# Patient Record
Sex: Female | Born: 1985 | Race: White | Hispanic: No | Smoking: Former smoker
Health system: Southern US, Community
[De-identification: ages and names within clinical notes are randomized; demographics above are authoritative.]

## PROBLEM LIST (undated history)

## (undated) DIAGNOSIS — IMO0002 Reserved for concepts with insufficient information to code with codable children: Secondary | ICD-10-CM

## (undated) DIAGNOSIS — A630 Anogenital (venereal) warts: Secondary | ICD-10-CM

## (undated) DIAGNOSIS — F419 Anxiety disorder, unspecified: Secondary | ICD-10-CM

## (undated) DIAGNOSIS — N730 Acute parametritis and pelvic cellulitis: Secondary | ICD-10-CM

## (undated) DIAGNOSIS — F329 Major depressive disorder, single episode, unspecified: Secondary | ICD-10-CM

## (undated) DIAGNOSIS — J189 Pneumonia, unspecified organism: Secondary | ICD-10-CM

## (undated) DIAGNOSIS — F32A Depression, unspecified: Secondary | ICD-10-CM

## (undated) HISTORY — PX: INCISE AND DRAIN ABCESS: PRO64

## (undated) HISTORY — PX: BREAST SURGERY: SHX581

## (undated) HISTORY — DX: Acute parametritis and pelvic cellulitis: N73.0

---
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
## 2008-01-02 ENCOUNTER — Inpatient Hospital Stay (HOSPITAL_COMMUNITY): Admission: AD | Admit: 2008-01-02 | Discharge: 2008-01-02 | Payer: Self-pay | Admitting: Obstetrics & Gynecology

## 2008-08-12 ENCOUNTER — Inpatient Hospital Stay (HOSPITAL_COMMUNITY): Admission: AD | Admit: 2008-08-12 | Discharge: 2008-08-14 | Payer: Self-pay | Admitting: Obstetrics & Gynecology

## 2008-08-12 ENCOUNTER — Encounter (INDEPENDENT_AMBULATORY_CARE_PROVIDER_SITE_OTHER): Payer: Self-pay | Admitting: Obstetrics and Gynecology

## 2008-09-01 ENCOUNTER — Inpatient Hospital Stay (HOSPITAL_COMMUNITY): Admission: AD | Admit: 2008-09-01 | Discharge: 2008-09-01 | Payer: Self-pay | Admitting: Obstetrics and Gynecology

## 2009-09-01 ENCOUNTER — Inpatient Hospital Stay (HOSPITAL_COMMUNITY): Admission: AD | Admit: 2009-09-01 | Discharge: 2009-09-01 | Payer: Self-pay | Admitting: Obstetrics and Gynecology

## 2010-05-12 ENCOUNTER — Inpatient Hospital Stay (HOSPITAL_COMMUNITY)
Admission: AD | Admit: 2010-05-12 | Discharge: 2010-05-12 | Payer: Self-pay | Source: Home / Self Care | Attending: Obstetrics & Gynecology | Admitting: Obstetrics & Gynecology

## 2010-05-14 LAB — URINALYSIS, ROUTINE W REFLEX MICROSCOPIC
Bilirubin Urine: NEGATIVE
Hgb urine dipstick: NEGATIVE
Ketones, ur: NEGATIVE mg/dL
Nitrite: NEGATIVE
Protein, ur: NEGATIVE mg/dL
Specific Gravity, Urine: <1.005 — ABNORMAL LOW (ref 1.005–1.030)
Urine Glucose, Fasting: NEGATIVE mg/dL
Urobilinogen, UA: 0.2 mg/dL (ref 0.0–1.0)
pH: 6 (ref 5.0–8.0)

## 2010-06-16 ENCOUNTER — Inpatient Hospital Stay (HOSPITAL_COMMUNITY)
Admission: AD | Admit: 2010-06-16 | Discharge: 2010-06-19 | DRG: 775 | Disposition: A | Discharge status: Home / Self Care | Payer: Medicaid Other | Source: Ambulatory Visit | Attending: Obstetrics and Gynecology | Admitting: Obstetrics and Gynecology

## 2010-06-16 DIAGNOSIS — Z22330 Carrier of Group B streptococcus: Secondary | ICD-10-CM

## 2010-06-16 DIAGNOSIS — O99892 Other specified diseases and conditions complicating childbirth: Secondary | ICD-10-CM | POA: Diagnosis present

## 2010-06-17 LAB — RPR: RPR Ser Ql: NONREACTIVE

## 2010-06-17 LAB — CBC
HCT: 31 % — ABNORMAL LOW (ref 36.0–46.0)
Hemoglobin: 10 g/dL — ABNORMAL LOW (ref 12.0–15.0)
MCH: 30.6 pg (ref 26.0–34.0)
MCHC: 32.3 g/dL (ref 30.0–36.0)
MCV: 94.8 fL (ref 78.0–100.0)
Platelets: 192 10*3/uL (ref 150–400)
RBC: 3.27 MIL/uL — ABNORMAL LOW (ref 3.87–5.11)
RDW: 12.7 % (ref 11.5–15.5)
WBC: 13.2 10*3/uL — ABNORMAL HIGH (ref 4.0–10.5)

## 2010-06-17 LAB — ABO/RH: ABO/RH(D): O POS

## 2010-06-18 LAB — CBC
HCT: 30.7 % — ABNORMAL LOW (ref 36.0–46.0)
Hemoglobin: 9.7 g/dL — ABNORMAL LOW (ref 12.0–15.0)
MCH: 30.4 pg (ref 26.0–34.0)
MCHC: 31.6 g/dL (ref 30.0–36.0)
MCV: 96.2 fL (ref 78.0–100.0)
Platelets: 167 10*3/uL (ref 150–400)
RBC: 3.19 MIL/uL — ABNORMAL LOW (ref 3.87–5.11)
RDW: 12.7 % (ref 11.5–15.5)
WBC: 9.8 10*3/uL (ref 4.0–10.5)

## 2010-07-15 LAB — CBC
HCT: 37.1 % (ref 36.0–46.0)
Hemoglobin: 12.9 g/dL (ref 12.0–15.0)
MCHC: 34.9 g/dL (ref 30.0–36.0)
MCV: 97.9 fL (ref 78.0–100.0)
Platelets: 190 10*3/uL (ref 150–400)
RBC: 3.79 MIL/uL — ABNORMAL LOW (ref 3.87–5.11)
RDW: 12.2 % (ref 11.5–15.5)
WBC: 9.9 10*3/uL (ref 4.0–10.5)

## 2010-07-15 LAB — URINALYSIS, ROUTINE W REFLEX MICROSCOPIC
Bilirubin Urine: NEGATIVE
Glucose, UA: NEGATIVE mg/dL
Hgb urine dipstick: NEGATIVE
Ketones, ur: 15 mg/dL — AB
Nitrite: NEGATIVE
Protein, ur: NEGATIVE mg/dL
Specific Gravity, Urine: >1.03 — ABNORMAL HIGH (ref 1.005–1.030)
Urobilinogen, UA: 0.2 mg/dL (ref 0.0–1.0)
pH: 5 (ref 5.0–8.0)

## 2010-07-15 LAB — DIFFERENTIAL
Basophils Absolute: 0 10*3/uL (ref 0.0–0.1)
Basophils Relative: 0 % (ref 0–1)
Eosinophils Absolute: 0.1 10*3/uL (ref 0.0–0.7)
Eosinophils Relative: 1 % (ref 0–5)
Lymphocytes Relative: 30 % (ref 12–46)
Lymphs Abs: 3 10*3/uL (ref 0.7–4.0)
Monocytes Absolute: 0.6 10*3/uL (ref 0.1–1.0)
Monocytes Relative: 7 % (ref 3–12)
Neutro Abs: 6.2 10*3/uL (ref 1.7–7.7)
Neutrophils Relative %: 62 % (ref 43–77)

## 2010-07-15 LAB — WET PREP, GENITAL
Trich, Wet Prep: NONE SEEN
Yeast Wet Prep HPF POC: NONE SEEN

## 2010-07-15 LAB — GC/CHLAMYDIA PROBE AMP, GENITAL
Chlamydia, DNA Probe: NEGATIVE
GC Probe Amp, Genital: NEGATIVE

## 2010-08-06 LAB — CBC
HCT: 26.2 % — ABNORMAL LOW (ref 36.0–46.0)
HCT: 27.7 % — ABNORMAL LOW (ref 36.0–46.0)
Hemoglobin: 8.7 g/dL — ABNORMAL LOW (ref 12.0–15.0)
Hemoglobin: 9.4 g/dL — ABNORMAL LOW (ref 12.0–15.0)
MCHC: 33.1 g/dL (ref 30.0–36.0)
MCHC: 33.9 g/dL (ref 30.0–36.0)
MCV: 84.3 fL (ref 78.0–100.0)
MCV: 85.2 fL (ref 78.0–100.0)
Platelets: 186 10*3/uL (ref 150–400)
Platelets: 191 10*3/uL (ref 150–400)
RBC: 3.08 MIL/uL — ABNORMAL LOW (ref 3.87–5.11)
RBC: 3.29 MIL/uL — ABNORMAL LOW (ref 3.87–5.11)
RDW: 15.2 % (ref 11.5–15.5)
RDW: 15.2 % (ref 11.5–15.5)
WBC: 16.6 10*3/uL — ABNORMAL HIGH (ref 4.0–10.5)
WBC: 17.1 10*3/uL — ABNORMAL HIGH (ref 4.0–10.5)

## 2010-08-06 LAB — RPR: RPR Ser Ql: NONREACTIVE

## 2011-01-28 LAB — POCT PREGNANCY, URINE: Preg Test, Ur: POSITIVE

## 2011-04-28 HISTORY — PX: COSMETIC SURGERY: SHX468

## 2011-05-12 ENCOUNTER — Inpatient Hospital Stay (HOSPITAL_COMMUNITY)
Admission: AD | Admit: 2011-05-12 | Discharge: 2011-05-12 | Disposition: A | Discharge status: Home / Self Care | Payer: Self-pay | Source: Ambulatory Visit | Attending: Obstetrics & Gynecology | Admitting: Obstetrics & Gynecology

## 2011-05-12 ENCOUNTER — Encounter (HOSPITAL_COMMUNITY): Payer: Self-pay | Admitting: *Deleted

## 2011-05-12 DIAGNOSIS — Z30432 Encounter for removal of intrauterine contraceptive device: Secondary | ICD-10-CM | POA: Insufficient documentation

## 2011-05-12 DIAGNOSIS — M545 Low back pain, unspecified: Secondary | ICD-10-CM | POA: Insufficient documentation

## 2011-05-12 DIAGNOSIS — R102 Pelvic and perineal pain: Secondary | ICD-10-CM

## 2011-05-12 DIAGNOSIS — N949 Unspecified condition associated with female genital organs and menstrual cycle: Secondary | ICD-10-CM

## 2011-05-12 HISTORY — DX: Reserved for concepts with insufficient information to code with codable children: IMO0002

## 2011-05-12 HISTORY — DX: Depression, unspecified: F32.A

## 2011-05-12 HISTORY — DX: Major depressive disorder, single episode, unspecified: F32.9

## 2011-05-12 HISTORY — DX: Anxiety disorder, unspecified: F41.9

## 2011-05-12 HISTORY — DX: Anogenital (venereal) warts: A63.0

## 2011-05-12 LAB — URINALYSIS, ROUTINE W REFLEX MICROSCOPIC
Bilirubin Urine: NEGATIVE
Glucose, UA: NEGATIVE mg/dL
Hgb urine dipstick: NEGATIVE
Ketones, ur: NEGATIVE mg/dL
Leukocytes, UA: NEGATIVE
Nitrite: NEGATIVE
Protein, ur: NEGATIVE mg/dL
Specific Gravity, Urine: 1.01 (ref 1.005–1.030)
Urobilinogen, UA: 0.2 mg/dL (ref 0.0–1.0)
pH: 6.5 (ref 5.0–8.0)

## 2011-05-12 LAB — POCT PREGNANCY, URINE: Preg Test, Ur: NEGATIVE

## 2011-05-12 MED ORDER — KETOROLAC TROMETHAMINE 60 MG/2ML IM SOLN
60.0000 mg | Freq: Once | INTRAMUSCULAR | Status: AC
  Administered 2011-05-12: 60 mg via INTRAMUSCULAR
  Filled 2011-05-12: qty 2

## 2011-05-12 MED ORDER — OXYCODONE-ACETAMINOPHEN 5-325 MG PO TABS: 1.0000 | ORAL_TABLET | Freq: Four times a day (QID) | ORAL | Status: DC | PRN

## 2011-05-12 MED ORDER — OXYCODONE-ACETAMINOPHEN 5-325 MG PO TABS: 1.0000 | ORAL_TABLET | Freq: Four times a day (QID) | ORAL | Status: AC | PRN

## 2011-05-12 NOTE — Progress Notes (Signed)
Patient states she had a Mirena IUD placed after her last delivery last February. Has been having increasing low back pain and sharp vaginal pain for about 4 months ago. Has painful intercourse. Was seen at [facility] last night and diagnosed with an ovarian cyst but would not remove the IUD. Was given Tylenol 3 which is not helping.

## 2011-05-12 NOTE — Progress Notes (Signed)
States would like to have IUD taken out. States she would like to try Depo, but does not know if she can afford it. States she was diagnosed with chronic depression and was on medication when she still lived with her parents. States she cannot afford medications now. States she feels OK without meds. most of the time.

## 2011-05-12 NOTE — Progress Notes (Signed)
States had U/S at [facility] to assure correct placement of IUD. Was told she had an ovarian cyst. Was told they would not remove IUD.

## 2011-05-12 NOTE — ED Provider Notes (Signed)
History   Patient is a 26 year old [id number] who presents with chief complaint of lower back pain and request to have her Mirena IUD removed.  She visited [facility] last night requesting her Mirena be removed and it was not.  Pelvic ultrasound at [facility] revealed an ovarian cyst.  She does not currently have insurance therefore she did not schedule an appointment with Dr. Dareen Piano and instead reported to the MAU.  Patient reports recurrent yeast and urinary tract infections over the previous 4 months, lower back and abdominal pain not responsive to Ibuprofen and Tylenol 3.  Patient denies dysuria, vaginal discharge, hematuria, chest pain, shortness of breath, blurry vision and headaches.  It is the patient's clear preference to have the IUD removed.        Chief Complaint  Patient presents with  . Back Pain   HPI     Past Medical History  Diagnosis Date  . Abnormal Pap smear   . HPV (human papilloma virus) anogenital infection   . Depression   . Anxiety     History reviewed. No pertinent past surgical history.  History reviewed. No pertinent family history.  History  Substance Use Topics  . Smoking status: Current Everyday Smoker -- 0.5 packs/day  . Smokeless tobacco: Never Used  . Alcohol Use: Yes     Rarely    Allergies: No Known Allergies  Prescriptions prior to admission  Medication Sig Dispense Refill  . acetaminophen-codeine (TYLENOL #3) 300-30 MG per tablet Take 2 tablets by mouth every 4 (four) hours as needed. Takes for pain        Review of Systems  Constitutional: Positive for malaise/fatigue. Negative for fever and chills.  Eyes: Negative for blurred vision.  Respiratory: Negative for shortness of breath.   Cardiovascular: Negative for chest pain and leg swelling.  Gastrointestinal: Positive for abdominal pain (hypogastric) and blood in stool. Negative for nausea, vomiting and diarrhea.  Genitourinary: Negative for dysuria and hematuria.    Musculoskeletal: Positive for back pain.  Neurological: Negative for loss of consciousness and headaches.   Physical Exam   Blood pressure 125/79, pulse 117, resp. rate 20, height 5' 4.5" (1.638 m), weight 53.524 kg (118 lb), SpO2 97.00%.  Physical Exam  Constitutional: She is oriented to person, place, and time. She appears well-developed and well-nourished.  HENT:  Head: Normocephalic and atraumatic.  Eyes: Pupils are equal, round, and reactive to light.  Cardiovascular: Regular rhythm and normal heart sounds.  Exam reveals no gallop and no friction rub.   No murmur heard. Respiratory: Effort normal and breath sounds normal. No respiratory distress. She has no wheezes.  GI: Soft. She exhibits no mass. There is tenderness (hypogastric). There is no rebound.  Genitourinary: Uterus is tender (mild). Cervix exhibits no motion tenderness and no discharge. Right adnexum displays tenderness (mild). Right adnexum displays no mass and no fullness. Left adnexum displays tenderness (mild). Left adnexum displays no mass and no fullness. There is tenderness (mild) around the vagina. No bleeding around the vagina. No signs of injury around the vagina. No vaginal discharge found.  Neurological: She is alert and oriented to person, place, and time.  Skin: Skin is warm and dry.  Psychiatric: She has a normal mood and affect. Her behavior is normal.    MAU Course  Procedures: IUD string located and removed easily with ring forceps.  No bleeding seen after removal.   Per patient, cultures were taken at [facility] last night.  Not repeated today.  Toradol 60 mg given IM in MAU  Having trouble with printing of Percocet #10==computer sent it electronically.  I called pharmacy in [facility] to alert them and they will not fill Rx.  Informed them I will try again to print Rx.    Assessment and Plan  Assessment 1) Pelvic pain 2) Request for IUD removal Plan 1) Discussed at length the patient's  return to fertility upon IUD removal.  Instructed her to use condoms until seen by Dr. Dareen Piano.  Call office to make appointment.   2) Rx for percocet #10  PIERCE, BENJAMIN 05/12/2011, 2:30 PM   Matt Holmes, NP 05/12/11 1515

## 2011-10-22 ENCOUNTER — Encounter: Payer: Self-pay | Admitting: Obstetrics & Gynecology

## 2011-11-27 ENCOUNTER — Encounter: Payer: Self-pay | Admitting: Obstetrics & Gynecology

## 2011-11-27 ENCOUNTER — Ambulatory Visit (INDEPENDENT_AMBULATORY_CARE_PROVIDER_SITE_OTHER): Payer: Self-pay | Admitting: Obstetrics & Gynecology

## 2011-11-27 VITALS — BP 117/68 | HR 103 | Temp 97.4°F | Ht 64.0 in | Wt 108.7 lb

## 2011-11-27 DIAGNOSIS — N949 Unspecified condition associated with female genital organs and menstrual cycle: Secondary | ICD-10-CM

## 2011-11-27 DIAGNOSIS — R102 Pelvic and perineal pain: Secondary | ICD-10-CM

## 2011-11-27 LAB — WET PREP, GENITAL: Trich, Wet Prep: NONE SEEN

## 2011-11-27 MED ORDER — IBUPROFEN 800 MG PO TABS: 800.0000 mg | ORAL_TABLET | Freq: Three times a day (TID) | ORAL | Status: AC | PRN

## 2011-11-27 MED ORDER — IBUPROFEN 200 MG PO TABS: ORAL_TABLET | ORAL | Status: DC

## 2011-11-27 NOTE — Progress Notes (Signed)
States here because thought she needs a biopsy as told by [facility] . Also was seen at [facility] for PID few months ago and treated and pain resolved until it came back recently.  Discussed with Dr. Marice Potter and will see for pelvic pain/PID today and get records and see another time for fu for abnormal pap

## 2011-11-27 NOTE — Progress Notes (Signed)
  Subjective:    Patient ID: Janice Saunders, female    DOB: 1986/01/08, 26 y.o.   MRN: [id number]  HPI  26 yo with a 2 day h/o pelvic pain. She has tried an occasional IBU 4-200mg  tabs. She tells me she was diagnosed with PID several months ago and treated with ABX. She tells me that she has been with the same man for 7 years. Sex 3 days ago caused some pain. She denies a h/o GC or CT.   Review of Systems   She tells me her pap at the [facility] was abnormal and that she needs a biopsy (Neither she nor I have a copy of this pap) Objective:   Physical Exam Normal vaginal/cervical discharge.  No CMT Uterus- NSSA, mobile, she describes pain with deep palpation of uterus Adnexa- NT, no masses       Assessment & Plan:  Pelvic pain, recurrrent. I have offered RTC IBU 800 mg (but not narcotics). I will get a wet prep, cervical cultures, and a u/s.

## 2011-11-28 LAB — GC/CHLAMYDIA PROBE AMP, URINE
Chlamydia, Swab/Urine, PCR: NEGATIVE
GC Probe Amp, Urine: NEGATIVE

## 2011-12-02 ENCOUNTER — Telehealth: Payer: Self-pay | Admitting: *Deleted

## 2011-12-02 DIAGNOSIS — B373 Candidiasis of vulva and vagina: Secondary | ICD-10-CM

## 2011-12-02 MED ORDER — FLUCONAZOLE 150 MG PO TABS: 150.0000 mg | ORAL_TABLET | Freq: Once | ORAL | Status: AC

## 2011-12-02 NOTE — Telephone Encounter (Signed)
Message copied by Mannie Stabile on Wed Dec 02, 2011  9:27 AM ------      Message from: Nicholaus Bloom C      Created: Mon Nov 30, 2011  1:14 PM       Can you please call her in diflucan 150 mg once, to be repeated once in 3 days.      Thanks

## 2011-12-02 NOTE — Telephone Encounter (Signed)
Spoke with patient and informed her that she has a prescription to pick up.

## 2011-12-04 ENCOUNTER — Ambulatory Visit (HOSPITAL_COMMUNITY): Admission: RE | Admit: 2011-12-04 | Payer: Self-pay | Source: Ambulatory Visit

## 2011-12-24 ENCOUNTER — Telehealth: Payer: Self-pay | Admitting: *Deleted

## 2011-12-24 NOTE — Telephone Encounter (Signed)
Called pt about her missed [place] appt on 12/04/11.  I asked if she would like to re-schedule. She said that she would like another appt on any day after 1300. She said I can call back with appt info. I scheduled appt and called back- had to leave a message that her appt will be on 12/30/11 @ 1345. She should arrive @ 1330 and have a full bladder. She may call back if she has questions.

## 2011-12-30 ENCOUNTER — Ambulatory Visit (HOSPITAL_COMMUNITY): Payer: Self-pay

## 2012-07-26 ENCOUNTER — Encounter (HOSPITAL_COMMUNITY): Payer: Self-pay | Admitting: Emergency Medicine

## 2012-07-26 ENCOUNTER — Emergency Department (HOSPITAL_COMMUNITY): Payer: Medicaid Other

## 2012-07-26 ENCOUNTER — Inpatient Hospital Stay (HOSPITAL_COMMUNITY)
Admission: EM | Admit: 2012-07-26 | Discharge: 2012-07-28 | DRG: 917 | Disposition: A | Discharge status: Other Acute Inpatient Hospital | Payer: Medicaid Other | Attending: Internal Medicine | Admitting: Internal Medicine

## 2012-07-26 DIAGNOSIS — T50901D Poisoning by unspecified drugs, medicaments and biological substances, accidental (unintentional), subsequent encounter: Secondary | ICD-10-CM

## 2012-07-26 DIAGNOSIS — T401X4A Poisoning by heroin, undetermined, initial encounter: Principal | ICD-10-CM

## 2012-07-26 DIAGNOSIS — G929 Unspecified toxic encephalopathy: Secondary | ICD-10-CM | POA: Diagnosis present

## 2012-07-26 DIAGNOSIS — F17200 Nicotine dependence, unspecified, uncomplicated: Secondary | ICD-10-CM | POA: Diagnosis present

## 2012-07-26 DIAGNOSIS — A630 Anogenital (venereal) warts: Secondary | ICD-10-CM | POA: Diagnosis present

## 2012-07-26 DIAGNOSIS — G9340 Encephalopathy, unspecified: Secondary | ICD-10-CM | POA: Diagnosis present

## 2012-07-26 DIAGNOSIS — F329 Major depressive disorder, single episode, unspecified: Secondary | ICD-10-CM | POA: Diagnosis present

## 2012-07-26 DIAGNOSIS — T401X1A Poisoning by heroin, accidental (unintentional), initial encounter: Secondary | ICD-10-CM | POA: Diagnosis present

## 2012-07-26 DIAGNOSIS — T50901A Poisoning by unspecified drugs, medicaments and biological substances, accidental (unintentional), initial encounter: Secondary | ICD-10-CM

## 2012-07-26 DIAGNOSIS — B977 Papillomavirus as the cause of diseases classified elsewhere: Secondary | ICD-10-CM

## 2012-07-26 DIAGNOSIS — D649 Anemia, unspecified: Secondary | ICD-10-CM | POA: Diagnosis present

## 2012-07-26 DIAGNOSIS — Y9289 Other specified places as the place of occurrence of the external cause: Secondary | ICD-10-CM

## 2012-07-26 DIAGNOSIS — R0789 Other chest pain: Secondary | ICD-10-CM | POA: Diagnosis present

## 2012-07-26 DIAGNOSIS — F419 Anxiety disorder, unspecified: Secondary | ICD-10-CM | POA: Diagnosis present

## 2012-07-26 DIAGNOSIS — G92 Toxic encephalopathy: Secondary | ICD-10-CM | POA: Diagnosis present

## 2012-07-26 DIAGNOSIS — F1310 Sedative, hypnotic or anxiolytic abuse, uncomplicated: Secondary | ICD-10-CM | POA: Diagnosis present

## 2012-07-26 DIAGNOSIS — E876 Hypokalemia: Secondary | ICD-10-CM | POA: Diagnosis present

## 2012-07-26 DIAGNOSIS — F1210 Cannabis abuse, uncomplicated: Secondary | ICD-10-CM | POA: Diagnosis present

## 2012-07-26 DIAGNOSIS — F3289 Other specified depressive episodes: Secondary | ICD-10-CM | POA: Diagnosis present

## 2012-07-26 DIAGNOSIS — N730 Acute parametritis and pelvic cellulitis: Secondary | ICD-10-CM | POA: Diagnosis present

## 2012-07-26 DIAGNOSIS — F1120 Opioid dependence, uncomplicated: Secondary | ICD-10-CM | POA: Diagnosis present

## 2012-07-26 DIAGNOSIS — F411 Generalized anxiety disorder: Secondary | ICD-10-CM | POA: Diagnosis present

## 2012-07-26 LAB — TSH: TSH: 1.557 u[IU]/mL (ref 0.350–4.500)

## 2012-07-26 LAB — COMPREHENSIVE METABOLIC PANEL
ALT: 13 U/L (ref 0–35)
AST: 17 U/L (ref 0–37)
CO2: 23 mEq/L (ref 19–32)
Calcium: 8.2 mg/dL — ABNORMAL LOW (ref 8.4–10.5)
Creatinine, Ser: 0.53 mg/dL (ref 0.50–1.10)
GFR calc non Af Amer: >90 mL/min (ref >90)
Sodium: 139 mEq/L (ref 135–145)
Total Protein: 6.1 g/dL (ref 6.0–8.3)

## 2012-07-26 LAB — ETHANOL: Alcohol, Ethyl (B): <11 mg/dL (ref 0–11)

## 2012-07-26 LAB — RAPID URINE DRUG SCREEN, HOSP PERFORMED
Amphetamines: NOT DETECTED
Cocaine: NOT DETECTED
Opiates: POSITIVE — AB

## 2012-07-26 LAB — URINALYSIS, ROUTINE W REFLEX MICROSCOPIC
Hgb urine dipstick: NEGATIVE
Nitrite: NEGATIVE
Specific Gravity, Urine: 1.028 (ref 1.005–1.030)
Urobilinogen, UA: 0.2 mg/dL (ref 0.0–1.0)
pH: 5.5 (ref 5.0–8.0)

## 2012-07-26 LAB — CBC WITH DIFFERENTIAL/PLATELET
Basophils Absolute: 0 10*3/uL (ref 0.0–0.1)
Basophils Relative: 0 % (ref 0–1)
Eosinophils Absolute: 0.1 10*3/uL (ref 0.0–0.7)
Eosinophils Relative: 1 % (ref 0–5)
HCT: 35.5 % — ABNORMAL LOW (ref 36.0–46.0)
MCH: 33.3 pg (ref 26.0–34.0)
MCHC: 33.8 g/dL (ref 30.0–36.0)
MCV: 98.6 fL (ref 78.0–100.0)
Monocytes Absolute: 0.6 10*3/uL (ref 0.1–1.0)
Platelets: 218 10*3/uL (ref 150–400)
RDW: 11.9 % (ref 11.5–15.5)
WBC: 9.7 10*3/uL (ref 4.0–10.5)

## 2012-07-26 LAB — URINE MICROSCOPIC-ADD ON

## 2012-07-26 LAB — PREGNANCY, URINE: Preg Test, Ur: NEGATIVE

## 2012-07-26 LAB — SALICYLATE LEVEL: Salicylate Lvl: <2 mg/dL — ABNORMAL LOW (ref 2.8–20.0)

## 2012-07-26 MED ORDER — NICOTINE 21 MG/24HR TD PT24: 21.0000 mg | MEDICATED_PATCH | Freq: Every day | TRANSDERMAL | Status: DC

## 2012-07-26 MED ORDER — SODIUM CHLORIDE 0.9 % IV SOLN
INTRAVENOUS | Status: DC
  Administered 2012-07-26 – 2012-07-27 (×3): via INTRAVENOUS

## 2012-07-26 MED ORDER — SODIUM CHLORIDE 0.9 % IJ SOLN
3.0000 mL | Freq: Two times a day (BID) | INTRAMUSCULAR | Status: DC
  Administered 2012-07-28: 3 mL via INTRAVENOUS

## 2012-07-26 MED ORDER — ACETAMINOPHEN 325 MG PO TABS
650.0000 mg | ORAL_TABLET | Freq: Four times a day (QID) | ORAL | Status: DC | PRN
  Filled 2012-07-26 (×2): qty 2

## 2012-07-26 MED ORDER — ALBUTEROL SULFATE (5 MG/ML) 0.5% IN NEBU: 2.5000 mg | INHALATION_SOLUTION | RESPIRATORY_TRACT | Status: DC | PRN

## 2012-07-26 MED ORDER — NALOXONE HCL 0.4 MG/ML IJ SOLN
0.4000 mg | Freq: Once | INTRAMUSCULAR | Status: AC
  Administered 2012-07-26: 0.4 mg via INTRAVENOUS
  Filled 2012-07-26: qty 1

## 2012-07-26 MED ORDER — NALOXONE HCL 1 MG/ML IJ SOLN
0.2500 mg/h | INTRAVENOUS | Status: DC
  Administered 2012-07-26: 0.25 mg/h via INTRAVENOUS
  Filled 2012-07-26: qty 4

## 2012-07-26 MED ORDER — ONDANSETRON HCL 4 MG/2ML IJ SOLN: 4.0000 mg | Freq: Four times a day (QID) | INTRAMUSCULAR | Status: DC | PRN

## 2012-07-26 MED ORDER — CLONAZEPAM 0.5 MG PO TABS
0.5000 mg | ORAL_TABLET | Freq: Three times a day (TID) | ORAL | Status: DC | PRN
  Administered 2012-07-26 – 2012-07-28 (×6): 0.5 mg via ORAL
  Filled 2012-07-26 (×6): qty 1

## 2012-07-26 MED ORDER — ONDANSETRON HCL 4 MG PO TABS
4.0000 mg | ORAL_TABLET | Freq: Four times a day (QID) | ORAL | Status: DC | PRN
  Administered 2012-07-27: 4 mg via ORAL
  Filled 2012-07-26: qty 1

## 2012-07-26 MED ORDER — POLYETHYLENE GLYCOL 3350 17 G PO PACK
17.0000 g | PACK | Freq: Every day | ORAL | Status: DC | PRN
  Filled 2012-07-26: qty 1

## 2012-07-26 MED ORDER — GUAIFENESIN-DM 100-10 MG/5ML PO SYRP: 5.0000 mL | ORAL_SOLUTION | ORAL | Status: DC | PRN

## 2012-07-26 MED ORDER — ALUM & MAG HYDROXIDE-SIMETH 200-200-20 MG/5ML PO SUSP: 30.0000 mL | Freq: Four times a day (QID) | ORAL | Status: DC | PRN

## 2012-07-26 MED ORDER — ACETAMINOPHEN 650 MG RE SUPP: 650.0000 mg | Freq: Four times a day (QID) | RECTAL | Status: DC | PRN

## 2012-07-26 NOTE — H&P (Addendum)
[facility]                                                                                    Patient Demographics  Janice Saunders, is a 27 y.o. female  CSN: [id number]  MRN: [id number]  DOB - 05-04-1985  Admit Date - 07/26/2012  Outpatient Primary MD for the patient is No primary provider on file.   With History of -  Past Medical History  Diagnosis Date  . Abnormal Pap smear   . HPV (human papilloma virus) anogenital infection   . Depression   . Anxiety   . PID (acute pelvic inflammatory disease)       Past Surgical History  Procedure Laterality Date  . Cosmetic surgery  2013    Breast augmentation    in for   Chief Complaint  Patient presents with  . Drug Overdose  . Altered Mental Status     HPI  Janice Saunders  is a 27 y.o. female, with history of PID, discussion drug abuse, who was brought in by EMS after they were found passed out side her mother's apartment in the car, she was found passed out with her husband in the same condition, they were then brought to the ER where after Narcan they provided the history that since they were unable to get hold of prescription medications for dictation they obtained some heroine and proceeded to snort that, in the ER husband was subsequently arrested, patient received some Narcan however became somnolent soon thereafter, urine drug screen was positive for Narcan benzos and marijuana, she was placed on Narcan drip and I was called to admit the patient.   Review of Systems is limited as patient is somnolent, she woke up with sternal rubs and gentle shaking of her shoulders and answered a few questions and then went back to sleep, she is abusive, she says she's not XXXXing drug junky   Brief review of systems  Briefly she denies any headache chest pain or belly pain, says she's not pregnant, says she was not trying to hurt results, says her husband got some heroin as they were unable to get prescription pain  medications she usually use, she then snorted some heroine for recreational purposes.   Social History History  Substance Use Topics  . Smoking status: Current Every Day Smoker -- 0.50 packs/day    Types: Cigarettes  . Smokeless tobacco: Never Used  . Alcohol Use: Yes     Comment: Rarely      Family History Says no history of suicide in family  Prior to Admission medications   Not on File    No Known Allergies  Physical Exam  Vitals  Blood pressure 113/68, pulse 95, temperature 97.6 F (36.4 C), temperature source Oral, resp. rate 16, SpO2 100.00%.   1. General Young white female lying in bed in NAD, she is visibly extremely drowsy, she woke up with sternal rubs and some shaking of shoulders to wake her up,  2. denies any suicidal or homicidal ideations  3. No F.N deficits, ALL C.Nerves Intact, Strength 5/5 all 4 extremities, Sensation intact all 4 extremities, Plantars down going.  4.  Ears and Eyes appear Normal, Conjunctivae clear, PERRLA. Moist Oral Mucosa.  5. Supple Neck, No JVD, No cervical lymphadenopathy appriciated, No Carotid Bruits.  6. Symmetrical Chest wall movement, Good air movement bilaterally, CTAB.  7. RRR, No Gallops, Rubs or Murmurs, No Parasternal Heave.  8. Positive Bowel Sounds, Abdomen Soft, Non tender, No organomegaly appriciated,No rebound -guarding or rigidity.  9.  No Cyanosis, Normal Skin Turgor, No Skin Rash or Bruise. Some tattoos on her skin  10. Good muscle tone,  joints appear normal , no effusions, Normal ROM.  11. No Palpable Lymph Nodes in Neck or Axillae     Data Review  CBC  Recent Labs Lab 07/26/12 1415  WBC 9.7  HGB 12.0  HCT 35.5*  PLT 218  MCV 98.6  MCH 33.3  MCHC 33.8  RDW 11.9  LYMPHSABS 2.2  MONOABS 0.6  EOSABS 0.1  BASOSABS 0.0   ------------------------------------------------------------------------------------------------------------------  Chemistries   Recent Labs Lab 07/26/12 1415   NA 139  K 3.6  CL 107  CO2 23  GLUCOSE 79  BUN 16  CREATININE 0.53  CALCIUM 8.2*  AST 17  ALT 13  ALKPHOS 48  BILITOT 0.5    Results for EIMI, VINEY (MRN [id number]) as of 07/26/2012 16:26  Ref. Range 07/26/2012 14:15 07/26/2012 14:20  Alcohol, Ethyl (B) Latest Range: 0-11 mg/dL <62   Amphetamines Latest Range: NONE DETECTED   NONE DETECTED  Barbiturates Latest Range: NONE DETECTED   NONE DETECTED  Benzodiazepines Latest Range: NONE DETECTED   POSITIVE (A)  Opiates Latest Range: NONE DETECTED   POSITIVE (A)  COCAINE Latest Range: NONE DETECTED   NONE DETECTED  Tetrahydrocannabinol Latest Range: NONE DETECTED   POSITIVE (A)    Results for KEYLAH, DARWISH (MRN [id number]) as of 07/26/2012 16:26  Ref. Range 07/26/2012 14:15  Salicylate Lvl Latest Range: 2.8-20.0 mg/dL <6.9 (L)  Acetaminophen (Tylenol), Serum Latest Range: 10-30 ug/mL <15.0   ------------------------------------------------------------------------------------------------------------------ CrCl is unknown because both a height and weight (above a minimum accepted value) are required for this calculation. ------------------------------------------------------------------------------------------------------------------ No results found for this basename: TSH, T4TOTAL, FREET3, T3FREE, THYROIDAB,  in the last 72 hours   Coagulation profile No results found for this basename: INR, PROTIME,  in the last 168 hours ------------------------------------------------------------------------------------------------------------------- No results found for this basename: DDIMER,  in the last 72 hours -------------------------------------------------------------------------------------------------------------------  Cardiac Enzymes No results found for this basename: CK, CKMB, TROPONINI, MYOGLOBIN,  in the last 168  hours ------------------------------------------------------------------------------------------------------------------ No components found with this basename: POCBNP,    ---------------------------------------------------------------------------------------------------------------  Urinalysis    Component Value Date/Time   COLORURINE AMBER* 07/26/2012 1425   APPEARANCEUR CLEAR 07/26/2012 1425   LABSPEC 1.028 07/26/2012 1425   PHURINE 5.5 07/26/2012 1425   GLUCOSEU NEGATIVE 07/26/2012 1425   HGBUR NEGATIVE 07/26/2012 1425   BILIRUBINUR SMALL* 07/26/2012 1425   KETONESUR NEGATIVE 07/26/2012 1425   PROTEINUR 100* 07/26/2012 1425   UROBILINOGEN 0.2 07/26/2012 1425   NITRITE NEGATIVE 07/26/2012 1425   LEUKOCYTESUR NEGATIVE 07/26/2012 1425    ----------------------------------------------------------------------------------------------------------------  Imaging results:   Dg Chest Port 1 View  07/26/2012  *RADIOLOGY REPORT*  Clinical Data: Overdose  PORTABLE CHEST - 1 VIEW  Comparison: None.  Findings: Cardiomediastinal silhouette is unremarkable.  No acute infiltrate or pleural effusion.  No pulmonary edema.  Bony thorax is unremarkable.  IMPRESSION: No active disease.   Original Report Authenticated By: Natasha Mead, M.D.     My personal review of EKG: Rhythm NSR,   no Acute ST changes  Assessment & Plan    1. Decreased mental status due to accidental drug over does - admits to snorting heroin, denies any IV drug use, urine drug screen positive for narcotics, benzos and marijuana, she will be kept in 23 hour observation in step-down for close airway and neurological monitoring, neurochecks every 4 hours,IVF and IV Narcain drip for now to continue, n.p.o., oxygen as needed, aspiration precautions, elevate head of the bed. Social work consult once patient is alert awake.   2. History of PID, HPV infection. Outpatient monitoring with PCP and OB/GYN.   3. History of depression patient denies any  suicidal homicidal ideations, she says that this was a recreational overdose. She has been counseled to stop all recreational drug use.   DVT Prophylaxis   SCDs    AM Labs Ordered, also please review Full Orders  Family Communication: Admission, patients condition and plan of care including tests being ordered have been discussed with the patient  who indicates  understanding and agree with the plan and Code Status.  Code Status Full  Likely DC to  Home  Time spent in minutes : 35  Condition Marinell Blight K M.D on 07/26/2012 at 4:26 PM  Between 7am to 7pm - Pager - [phone]  After 7pm go to [email or web address] - password [email or web address]  And look for the night coverage person covering me after hours  [facility] Office  [phone]

## 2012-07-26 NOTE — ED Notes (Addendum)
Per EMS.  Pt found unresponsive in car with significant other.  Pt told EMS she snorted heroin for the first time this am.  States she has previously taken tablets, but could not find any.  Pt initially able to respond, but then became more lethergic and only responded to pain. Nasal trumpet inserted. EMS gave pt narcan 10 minutes prior to arrival, pt responded to treatment and became responsive. Pt alert upon arrival and able to answer questions.  Denies taking any other drugs at this time

## 2012-07-26 NOTE — Progress Notes (Signed)
During [facility] ED 07/26/12 visit CM spoke with pt who confirms self pay [facility] resident with no pcp.  Pt noted to be minimally alert during assessment  CM provided written information in pt's pt belonging bag for self pay pcps, importance of pcp for f/u care, www.needymeds.org, discounted pharmacies, MATCH program and other [place] resources such as financial assistance, DSS and  health department Reviewed Health connect number to assist with finding self pay provider close to pt's residence. Reviewed resources for [place] self pay pcps like [facility], family medicine at [address], [facility] family practice, general medical clinics, [facility] urgent care plus others, [facility] out patient [facility]ies, housing and other resources in [place]

## 2012-07-26 NOTE — ED Notes (Signed)
PT denies SI.

## 2012-07-26 NOTE — ED Provider Notes (Signed)
History     CSN: [id number]  Arrival date & time 07/26/12  1306   First MD Initiated Contact with Patient 07/26/12 1308      Chief Complaint  Patient presents with  . Drug Overdose  . Altered Mental Status    (Consider location/radiation/quality/duration/timing/severity/associated sxs/prior treatment) HPI Comments: Patient brought to the ER by EMS. Patient was found unresponsive in a vehicle. EMS report that she did arouse to voice initially, but then became more somnolent. She admitted to snorting heroin to EMS. She reports that she regularly takes pills, but could not get any so she used heroin for the first time.  When the patient became more somnolent, she was given Narcan and did have response. At arrival to the ER patient is awake but sleepy. She answers questions appropriately. She expresses remorse, stating that she is concerned about her children. She says that she was not trying to hurt herself in anyway when she took the heroin.  Patient is a 27 y.o. female presenting with Overdose and altered mental status.  Drug Overdose  Altered Mental Status    Past Medical History  Diagnosis Date  . Abnormal Pap smear   . HPV (human papilloma virus) anogenital infection   . Depression   . Anxiety   . PID (acute pelvic inflammatory disease)     Past Surgical History  Procedure Laterality Date  . Cosmetic surgery  2013    Breast augmentation    History reviewed. No pertinent family history.  History  Substance Use Topics  . Smoking status: Current Every Day Smoker -- 0.50 packs/day    Types: Cigarettes  . Smokeless tobacco: Never Used  . Alcohol Use: Yes     Comment: Rarely    OB History   Grav Para Term Preterm Abortions TAB SAB Ect Mult Living   2 2 2  0 0 0 0 0 0 2      Review of Systems  Psychiatric/Behavioral: Positive for altered mental status. Negative for suicidal ideas and self-injury. The patient is nervous/anxious.   All other systems reviewed and  are negative.    Allergies  Review of patient's allergies indicates no known allergies.  Home Medications   Current Outpatient Rx  Name  Route  Sig  Dispense  Refill  . ibuprofen (ADVIL,MOTRIN) 200 MG tablet      Take 1 tablet every 8 hours RTC until pain is resolved.   60 tablet   1     BP 113/68  Pulse 95  Temp(Src) 97.6 F (36.4 C) (Oral)  Resp 16  SpO2 100%  Physical Exam  Constitutional: She is oriented to person, place, and time. She appears well-developed and well-nourished. No distress.  HENT:  Head: Normocephalic and atraumatic.  Right Ear: Hearing normal.  Nose: Nose normal.  Mouth/Throat: Oropharynx is clear and moist and mucous membranes are normal.  Eyes: Conjunctivae and EOM are normal. Pupils are equal, round, and reactive to light.  Neck: Normal range of motion. Neck supple.  Cardiovascular: Normal rate, regular rhythm, S1 normal and S2 normal.  Exam reveals no gallop and no friction rub.   No murmur heard. Pulmonary/Chest: Effort normal and breath sounds normal. No respiratory distress. She exhibits no tenderness.  Abdominal: Soft. Normal appearance and bowel sounds are normal. There is no hepatosplenomegaly. There is no tenderness. There is no rebound, no guarding, no tenderness at McBurney's point and negative Murphy's sign. No hernia.  Musculoskeletal: Normal range of motion.  Neurological: She is alert and  oriented to person, place, and time. She has normal strength. No cranial nerve deficit or sensory deficit. Coordination normal. GCS eye subscore is 4. GCS verbal subscore is 5. GCS motor subscore is 6.  Skin: Skin is warm, dry and intact. No rash noted. No cyanosis.  Psychiatric: She has a normal mood and affect. Her speech is normal and behavior is normal. Thought content normal.    ED Course  Procedures (including critical care time)  Labs Reviewed  CBC WITH DIFFERENTIAL - Abnormal; Notable for the following:    RBC 3.60 (*)    HCT 35.5 (*)     All other components within normal limits  COMPREHENSIVE METABOLIC PANEL - Abnormal; Notable for the following:    Calcium 8.2 (*)    All other components within normal limits  URINALYSIS, ROUTINE W REFLEX MICROSCOPIC - Abnormal; Notable for the following:    Color, Urine AMBER (*)    Bilirubin Urine SMALL (*)    Protein, ur 100 (*)    All other components within normal limits  URINE RAPID DRUG SCREEN (HOSP PERFORMED) - Abnormal; Notable for the following:    Opiates POSITIVE (*)    Benzodiazepines POSITIVE (*)    Tetrahydrocannabinol POSITIVE (*)    All other components within normal limits  SALICYLATE LEVEL - Abnormal; Notable for the following:    Salicylate Lvl <2.0 (*)    All other components within normal limits  URINE MICROSCOPIC-ADD ON - Abnormal; Notable for the following:    Bacteria, UA FEW (*)    All other components within normal limits  PREGNANCY, URINE  ACETAMINOPHEN LEVEL  ETHANOL   Dg Chest Port 1 View  07/26/2012  *RADIOLOGY REPORT*  Clinical Data: Overdose  PORTABLE CHEST - 1 VIEW  Comparison: None.  Findings: Cardiomediastinal silhouette is unremarkable.  No acute infiltrate or pleural effusion.  No pulmonary edema.  Bony thorax is unremarkable.  IMPRESSION: No active disease.   Original Report Authenticated By: Natasha Mead, M.D.      Diagnosis: Accidental narcotic overdose    MDM  Patient presents to the ER after an accidental overdose. Patient reportedly uses prescription pain pills regularly. She was unable to get any and therefore obtained heroin today and snorted it for the first time. She was awake and sleepy at time of arrival for EMS and admitted to using the heroin. She became somnolent and required Narcan. During transport to the ER she became more awake and alert, but has now become more somnolent and required repeat dosing of Narcan and was placed on a Narcan drip. She will require hospitalization for further management of her  overdose.        Gilda Crease, MD 07/26/12 [phone]

## 2012-07-26 NOTE — ED Notes (Signed)
[id number]<AV> Expected date:<BR> Expected time:<BR> Means of arrival:<BR> Comments:<BR> overdose

## 2012-07-26 NOTE — Progress Notes (Signed)
Per chart review, patient expressed concern for children. CSW met with pt at bedside to confirm safety of children. Pt reported that patient are at day care at [facility] Pt states that patient dad or brother would be providing care for patient children. CSW asked patient if she could call to ensure safety of children, pt yelled, "No I dont want you calling anyone, No one has to tell you that my children are safe." CSW sought supervision and agreed with making CPS report.   Catha Gosselin, LCSWA  [phone] .07/26/2012 1558pm

## 2012-07-27 DIAGNOSIS — F411 Generalized anxiety disorder: Secondary | ICD-10-CM

## 2012-07-27 DIAGNOSIS — E876 Hypokalemia: Secondary | ICD-10-CM | POA: Diagnosis not present

## 2012-07-27 DIAGNOSIS — F1910 Other psychoactive substance abuse, uncomplicated: Secondary | ICD-10-CM

## 2012-07-27 DIAGNOSIS — G9340 Encephalopathy, unspecified: Secondary | ICD-10-CM

## 2012-07-27 DIAGNOSIS — Z5189 Encounter for other specified aftercare: Secondary | ICD-10-CM

## 2012-07-27 LAB — COMPREHENSIVE METABOLIC PANEL
ALT: 13 U/L (ref 0–35)
AST: 17 U/L (ref 0–37)
Albumin: 3.2 g/dL — ABNORMAL LOW (ref 3.5–5.2)
Alkaline Phosphatase: 45 U/L (ref 39–117)
Chloride: 106 mEq/L (ref 96–112)
Potassium: 3.4 mEq/L — ABNORMAL LOW (ref 3.5–5.1)
Sodium: 137 mEq/L (ref 135–145)
Total Bilirubin: 0.3 mg/dL (ref 0.3–1.2)

## 2012-07-27 LAB — CBC
MCV: 98.2 fL (ref 78.0–100.0)
Platelets: 185 10*3/uL (ref 150–400)
RBC: 3.34 MIL/uL — ABNORMAL LOW (ref 3.87–5.11)
WBC: 8.2 10*3/uL (ref 4.0–10.5)

## 2012-07-27 MED ORDER — CLONAZEPAM 0.5 MG PO TABS
0.5000 mg | ORAL_TABLET | Freq: Once | ORAL | Status: AC
  Administered 2012-07-28: 0.5 mg via ORAL
  Filled 2012-07-27: qty 1

## 2012-07-27 MED ORDER — IBUPROFEN 800 MG PO TABS
800.0000 mg | ORAL_TABLET | Freq: Once | ORAL | Status: AC
  Administered 2012-07-27: 800 mg via ORAL
  Filled 2012-07-27: qty 1

## 2012-07-27 MED ORDER — IBUPROFEN 400 MG PO TABS
400.0000 mg | ORAL_TABLET | Freq: Four times a day (QID) | ORAL | Status: DC | PRN
  Administered 2012-07-27: 400 mg via ORAL
  Filled 2012-07-27: qty 1

## 2012-07-27 MED ORDER — KETOROLAC TROMETHAMINE 15 MG/ML IJ SOLN
15.0000 mg | Freq: Three times a day (TID) | INTRAMUSCULAR | Status: DC | PRN
  Administered 2012-07-27 – 2012-07-28 (×3): 15 mg via INTRAVENOUS
  Filled 2012-07-27 (×3): qty 1

## 2012-07-27 MED ORDER — NICOTINE 21 MG/24HR TD PT24
21.0000 mg | MEDICATED_PATCH | Freq: Every day | TRANSDERMAL | Status: DC
  Administered 2012-07-27 – 2012-07-28 (×3): 21 mg via TRANSDERMAL
  Filled 2012-07-27 (×3): qty 1

## 2012-07-27 MED ORDER — LORAZEPAM 1 MG PO TABS
1.0000 mg | ORAL_TABLET | Freq: Once | ORAL | Status: AC
  Administered 2012-07-27: 1 mg via ORAL
  Filled 2012-07-27: qty 1

## 2012-07-27 MED ORDER — POTASSIUM CHLORIDE CRYS ER 20 MEQ PO TBCR
40.0000 meq | EXTENDED_RELEASE_TABLET | Freq: Once | ORAL | Status: AC
  Administered 2012-07-27: 40 meq via ORAL
  Filled 2012-07-27: qty 2
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
## 2012-07-27 NOTE — Progress Notes (Signed)
[place] of [facility] DSS came to visit with patient.  Several questions were asked to the RN by the county employee.  No information regarding patient status or medications was given to the [employer].   The best contact number for Re[place]ia is [phone].  She stated she would follow up with hospital social work tomorrow.  Kinnie Feil, RN

## 2012-07-28 ENCOUNTER — Inpatient Hospital Stay (HOSPITAL_COMMUNITY)
Admission: AD | Admit: 2012-07-28 | Discharge: 2012-08-01 | DRG: 897 | Disposition: A | Discharge status: Home / Self Care | Payer: No Typology Code available for payment source | Source: Ambulatory Visit | Attending: Psychiatry | Admitting: Psychiatry

## 2012-07-28 ENCOUNTER — Encounter (HOSPITAL_COMMUNITY): Payer: Self-pay | Admitting: Emergency Medicine

## 2012-07-28 DIAGNOSIS — F1110 Opioid abuse, uncomplicated: Principal | ICD-10-CM | POA: Diagnosis present

## 2012-07-28 DIAGNOSIS — Z79899 Other long term (current) drug therapy: Secondary | ICD-10-CM

## 2012-07-28 DIAGNOSIS — A630 Anogenital (venereal) warts: Secondary | ICD-10-CM

## 2012-07-28 DIAGNOSIS — F419 Anxiety disorder, unspecified: Secondary | ICD-10-CM

## 2012-07-28 DIAGNOSIS — F1210 Cannabis abuse, uncomplicated: Secondary | ICD-10-CM | POA: Diagnosis present

## 2012-07-28 DIAGNOSIS — F329 Major depressive disorder, single episode, unspecified: Secondary | ICD-10-CM

## 2012-07-28 DIAGNOSIS — T401X1A Poisoning by heroin, accidental (unintentional), initial encounter: Secondary | ICD-10-CM

## 2012-07-28 DIAGNOSIS — E876 Hypokalemia: Secondary | ICD-10-CM

## 2012-07-28 DIAGNOSIS — G9340 Encephalopathy, unspecified: Secondary | ICD-10-CM

## 2012-07-28 DIAGNOSIS — N730 Acute parametritis and pelvic cellulitis: Secondary | ICD-10-CM

## 2012-07-28 LAB — BASIC METABOLIC PANEL
BUN: 8 mg/dL (ref 6–23)
Calcium: 8.9 mg/dL (ref 8.4–10.5)
GFR calc non Af Amer: >90 mL/min (ref >90)
Glucose, Bld: 102 mg/dL — ABNORMAL HIGH (ref 70–99)

## 2012-07-28 LAB — CBC
MCH: 33.9 pg (ref 26.0–34.0)
MCHC: 34.6 g/dL (ref 30.0–36.0)
MCV: 97.9 fL (ref 78.0–100.0)
Platelets: 192 10*3/uL (ref 150–400)

## 2012-07-28 MED ORDER — NAPROXEN 500 MG PO TABS
500.0000 mg | ORAL_TABLET | Freq: Two times a day (BID) | ORAL | Status: DC | PRN
  Filled 2012-07-28: qty 1

## 2012-07-28 MED ORDER — HYDROXYZINE HCL 25 MG PO TABS: 25.0000 mg | ORAL_TABLET | Freq: Four times a day (QID) | ORAL | Status: DC | PRN

## 2012-07-28 MED ORDER — NICOTINE 21 MG/24HR TD PT24: 1.0000 | MEDICATED_PATCH | Freq: Every day | TRANSDERMAL | Status: DC

## 2012-07-28 MED ORDER — CLONIDINE HCL 0.1 MG PO TABS: 0.1000 mg | ORAL_TABLET | ORAL | Status: DC

## 2012-07-28 MED ORDER — LORAZEPAM 2 MG/ML IJ SOLN
0.5000 mg | Freq: Once | INTRAMUSCULAR | Status: AC
  Administered 2012-07-28: 0.5 mg via INTRAVENOUS

## 2012-07-28 MED ORDER — NAPROXEN 500 MG PO TABS
500.0000 mg | ORAL_TABLET | Freq: Two times a day (BID) | ORAL | Status: DC | PRN
  Administered 2012-07-28 – 2012-08-01 (×4): 500 mg via ORAL
  Filled 2012-07-28 (×4): qty 1

## 2012-07-28 MED ORDER — ALUM & MAG HYDROXIDE-SIMETH 200-200-20 MG/5ML PO SUSP: 30.0000 mL | ORAL | Status: DC | PRN

## 2012-07-28 MED ORDER — LOPERAMIDE HCL 2 MG PO CAPS: 2.0000 mg | ORAL_CAPSULE | ORAL | Status: DC | PRN

## 2012-07-28 MED ORDER — CLONIDINE HCL 0.1 MG PO TABS
0.1000 mg | ORAL_TABLET | Freq: Four times a day (QID) | ORAL | Status: AC
  Administered 2012-07-28 – 2012-07-30 (×5): 0.1 mg via ORAL
  Filled 2012-07-28 (×8): qty 1

## 2012-07-28 MED ORDER — HYDROXYZINE HCL 25 MG PO TABS
25.0000 mg | ORAL_TABLET | Freq: Four times a day (QID) | ORAL | Status: DC | PRN
  Filled 2012-07-28: qty 1

## 2012-07-28 MED ORDER — ONDANSETRON 4 MG PO TBDP: 4.0000 mg | ORAL_TABLET | Freq: Four times a day (QID) | ORAL | Status: DC | PRN

## 2012-07-28 MED ORDER — METHOCARBAMOL 500 MG PO TABS
500.0000 mg | ORAL_TABLET | Freq: Three times a day (TID) | ORAL | Status: DC | PRN
  Administered 2012-07-31 – 2012-08-01 (×3): 500 mg via ORAL
  Filled 2012-07-28 (×3): qty 1

## 2012-07-28 MED ORDER — LORAZEPAM 2 MG/ML IJ SOLN
0.5000 mg | Freq: Once | INTRAMUSCULAR | Status: DC
  Filled 2012-07-28: qty 1

## 2012-07-28 MED ORDER — ONDANSETRON 4 MG PO TBDP
4.0000 mg | ORAL_TABLET | Freq: Four times a day (QID) | ORAL | Status: DC | PRN
  Filled 2012-07-28: qty 1

## 2012-07-28 MED ORDER — TRAZODONE HCL 50 MG PO TABS
50.0000 mg | ORAL_TABLET | Freq: Every evening | ORAL | Status: DC | PRN
  Administered 2012-07-28 – 2012-07-31 (×8): 50 mg via ORAL
  Filled 2012-07-28 (×9): qty 1
  Filled 2012-07-28: qty 28
  Filled 2012-07-28: qty 1

## 2012-07-28 MED ORDER — DICYCLOMINE HCL 20 MG PO TABS: 20.0000 mg | ORAL_TABLET | Freq: Four times a day (QID) | ORAL | Status: DC | PRN

## 2012-07-28 MED ORDER — POLYETHYLENE GLYCOL 3350 17 G PO PACK: 17.0000 g | PACK | Freq: Every day | ORAL | Status: DC | PRN

## 2012-07-28 MED ORDER — CLONIDINE HCL 0.1 MG PO TABS: 0.1000 mg | ORAL_TABLET | Freq: Every day | ORAL | Status: DC

## 2012-07-28 MED ORDER — MAGNESIUM HYDROXIDE 400 MG/5ML PO SUSP: 30.0000 mL | Freq: Every day | ORAL | Status: DC | PRN

## 2012-07-28 MED ORDER — METHOCARBAMOL 500 MG PO TABS: 500.0000 mg | ORAL_TABLET | Freq: Three times a day (TID) | ORAL | Status: DC | PRN

## 2012-07-28 MED ORDER — NAPROXEN 500 MG PO TABS: 500.0000 mg | ORAL_TABLET | Freq: Two times a day (BID) | ORAL | Status: DC | PRN

## 2012-07-28 MED ORDER — DICYCLOMINE HCL 20 MG PO TABS
20.0000 mg | ORAL_TABLET | Freq: Four times a day (QID) | ORAL | Status: DC | PRN
  Filled 2012-07-28: qty 1

## 2012-07-28 MED ORDER — ACETAMINOPHEN 325 MG PO TABS: 650.0000 mg | ORAL_TABLET | Freq: Four times a day (QID) | ORAL | Status: DC | PRN

## 2012-07-28 MED ORDER — CLONIDINE HCL 0.1 MG PO TABS: 0.1000 mg | ORAL_TABLET | Freq: Four times a day (QID) | ORAL | Status: DC

## 2012-07-28 MED ORDER — NICOTINE 21 MG/24HR TD PT24
21.0000 mg | MEDICATED_PATCH | Freq: Every day | TRANSDERMAL | Status: DC
  Administered 2012-07-29 – 2012-07-31 (×3): 21 mg via TRANSDERMAL
  Filled 2012-07-28 (×6): qty 1

## 2012-07-28 MED ORDER — HYDROXYZINE HCL 25 MG PO TABS
25.0000 mg | ORAL_TABLET | Freq: Four times a day (QID) | ORAL | Status: DC | PRN
  Administered 2012-07-28 – 2012-07-31 (×5): 25 mg via ORAL
  Filled 2012-07-28: qty 1

## 2012-07-28 MED ORDER — CLONIDINE HCL 0.1 MG PO TABS
0.1000 mg | ORAL_TABLET | Freq: Every day | ORAL | Status: DC
  Filled 2012-07-28: qty 1

## 2012-07-28 MED ORDER — CLONIDINE HCL 0.1 MG PO TABS
0.1000 mg | ORAL_TABLET | ORAL | Status: AC
  Administered 2012-07-30 – 2012-07-31 (×4): 0.1 mg via ORAL
  Filled 2012-07-28 (×4): qty 1

## 2012-07-28 MED ORDER — CLONIDINE HCL 0.1 MG PO TABS
0.1000 mg | ORAL_TABLET | Freq: Four times a day (QID) | ORAL | Status: DC
  Administered 2012-07-28 (×2): 0.1 mg via ORAL
  Filled 2012-07-28 (×4): qty 1
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
## 2012-07-28 NOTE — Tx Team (Signed)
Initial Interdisciplinary Treatment Plan  PATIENT STRENGTHS: (choose at least two) Ability for insight Average or above average intelligence Capable of independent living Supportive family/friends  PATIENT STRESSORS: Marital or family conflict Substance abuse   PROBLEM LIST: Problem List/Patient Goals Date to be addressed Date deferred Reason deferred Estimated date of resolution  Depression 07/28/12     Substance Abuse 07/28/12                                                DISCHARGE CRITERIA:  Ability to meet basic life and health needs Improved stabilization in mood, thinking, and/or behavior Verbal commitment to aftercare and medication compliance  PRELIMINARY DISCHARGE PLAN: Attend aftercare/continuing care group Return to previous living arrangement  PATIENT/FAMIILY INVOLVEMENT: This treatment plan has been presented to and reviewed with the patient, Janice Saunders, and/or family member, .  The patient and family have been given the opportunity to ask questions and make suggestions.  Janice Saunders, [place] 07/28/2012, 8:25 PM

## 2012-07-28 NOTE — Progress Notes (Signed)
Called [facility], spoke to [place] who answered the consult call. Asked when patient would be revisited to finish consult and prepare plan of discharge for patient; noted entered for patient to be re-evaluated and would be seen later today; MD aware of plan
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
## 2012-07-28 NOTE — Progress Notes (Signed)
Clinical Social Work  Patient accepted to [facility] room 304-2 by Dr. Daleen Bo. CSW went to patient to explain transfer to [facility]. Patient became upset and reported that she would not go to [facility]. Per psych MD, patient needs to be IVC if refusing treatment. CSW spoke with patient regarding voluntary vs involuntary commitment. Patient continues to refuse [facility] placement for detox. CSW faxed IVC papers that were signed by MD. CSW called Magistrate to confirm fax received and to explain that patient needed transportation as well. Magistrate agreeable and reports that [employer] will transport. RN and MD aware of dc.CSW is signing off but available if needed.  [place], [place] [phone]

## 2012-07-28 NOTE — BHH Counselor (Signed)
Unk Lightning, Child psychotherapist at [employer], submitted Pt for admission to [facility]. Thurman Coyer, [facility] confirmed bed availability. Fransisca Kaufmann, NP reviewed clinical information and accepted Pt to the service of Dr. Henrietta Dine, room 304-2. Notified Unk Lightning of acceptance.  Harlin Rain Patsy Baltimore, LPC, [facility] Assessment Counselor
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
## 2012-07-28 NOTE — Discharge Summary (Signed)
Physician Discharge Summary  Janice Saunders BJY:[id number] DOB: [birth year] DOA: 07/26/2012  PCP: No primary provider on file.  Admit date: 07/26/2012 Discharge date: 07/28/2012  Time spent: Greater than 30 minutes  Recommendations for Outpatient Follow-up:  1. Patient being transferred to behavioral health Center for inpatient psychiatric care. Outpatient followup to be arranged at discharge from [facility]. 2. Patient was on Klonopin 0.5 mg by mouth q. 8 hours when necessary for anxiety at [facility] (not home medication). She has not been discharged on this & will defer decision to resume this to the psychiatrist at [facility].  Discharge Diagnoses:  Principal Problem:   Accidental drug overdose Active Problems:   Anxiety   Depression   PID (acute pelvic inflammatory disease)   HPV (human papilloma virus) anogenital infection   Acute encephalopathy   Hypokalemia   Discharge Condition: Improved & Stable  Diet recommendation: Regular diet  Filed Weights   07/26/12 1800 07/28/12 1021  Weight: 47.9 kg (105 lb 9.6 oz) 46.7 kg (102 lb 15.3 oz)    History of present illness:  27 year old female patient with history of polysubstance dependence/abuse (opioids at least-others not definitely known), anxiety and depression who was admitted to [facility] on 07/26/12 after she was found unresponsive in her car after doing drugs ( Heroin OD). She was apparently in the car along with her husband who was also doing drugs and their 2 young children aged 2 and 4 years. In the ED, UDS was positive for opioids, THC and benzodiazepines. She responded to Narcan initially but subsequently became somnolent. She was placed on a Narcan drip and admitted to the step down unit for further management.  Hospital Course:  1. Acute encephalopathy: Secondary to Heroin OD. She was briefly on Narcan drip which was discontinued on the same night of admission after few hours. This has resolved. 2. Polysubstance abuse:  Tobacco, THC, Heroin and opioids at least. She says this was the first time that she has used Heroin because she could not get any prescription opioids. She denies doing benzodiazepines although UDS was positive for it. She initially indicated that she wanted to go to the [facility] for detox. Psychiatry was consulted. They determined that she is not safe to discharge home , to herself or her family and recommend inpatient psychiatric care for further management. Now patient refuses admission to [facility]. As per psychiatrist recommendation, patient has been involuntarily committed and will be transferred to [facility] for further evaluation and management. She was started on clonidine protocol by the psychiatrist to avoid opioid withdrawal. 3. Anxiety and depression: Patient was on Klonopin as needed for anxiety which can be restarted at [facility] at the psychiatrists discretion. Patient denied suicidal or homicidal ideations. 4. Hypokalemia: Repleted 5. Chest pain, muscular/pleuritic: Resolved. 6. Anemia: Stable. Outpatient workup and followup when discharged from psychiatric hospital. 7. History of PID/HPV infection: Outpatient followup.  Procedures:  None   Consultations:  Psychiatry   Discharge Exam:  Complaints:  Patient denies chest pain. No suicidal or homicidal ideations.   Filed Vitals:   07/28/12 0800 07/28/12 1007 07/28/12 1021 07/28/12 1348  BP:  96/57  107/67  Pulse:  81  109  Temp: 98.1 F (36.7 C) 98.2 F (36.8 C)  98.2 F (36.8 C)  TempSrc: Oral Oral  Oral  Resp:  16  16  Height:      Weight:   46.7 kg (102 lb 15.3 oz)   SpO2:  96%  95%  General exam: Comfortable. Patient was sleeping but easily aroused.  Respiratory system: Clear. No increased work of breathing.  Cardiovascular system: S1 & S2 heard, RRR. No JVD, murmurs, gallops, clicks or pedal edema. Telemetry shows sinus rhythm.  Gastrointestinal system: Abdomen is nondistended, soft and nontender. Normal  bowel sounds heard.  Central nervous system: Alert and oriented. No focal neurological deficits.  Extremities: Symmetric 5 x 5 power.  Psychiatric: Does not appear anxious.  Discharge Instructions      Discharge Orders   Future Orders Complete By Expires     Activity as tolerated - No restrictions  As directed     Diet general  As directed         Medication List    TAKE these medications       cloNIDine 0.1 MG tablet  Commonly known as:  CATAPRES  Take 1 tablet (0.1 mg total) by mouth 4 (four) times daily. For 8 doses.     cloNIDine 0.1 MG tablet  Commonly known as:  CATAPRES  Take 1 tablet (0.1 mg total) by mouth 2 (two) times daily in the am and at bedtime.. For 4 doses.  Start taking on:  07/30/2012     cloNIDine 0.1 MG tablet  Commonly known as:  CATAPRES  Take 1 tablet (0.1 mg total) by mouth daily before breakfast. For 2 doses.  Start taking on:  08/01/2012     dicyclomine 20 MG tablet  Commonly known as:  BENTYL  Take 1 tablet (20 mg total) by mouth every 6 (six) hours as needed (abdominal cramping).     hydrOXYzine 25 MG tablet  Commonly known as:  ATARAX/VISTARIL  Take 1 tablet (25 mg total) by mouth every 6 (six) hours as needed for anxiety.     loperamide 2 MG capsule  Commonly known as:  IMODIUM  Take 1-2 capsules (2-4 mg total) by mouth as needed for diarrhea or loose stools (diarrhea).     methocarbamol 500 MG tablet  Commonly known as:  ROBAXIN  Take 1 tablet (500 mg total) by mouth every 8 (eight) hours as needed (muscle spasms.).     naproxen 500 MG tablet  Commonly known as:  NAPROSYN  Take 1 tablet (500 mg total) by mouth 2 (two) times daily as needed (aching, pain, or discomfort).     nicotine 21 mg/24hr patch  Commonly known as:  NICODERM CQ - dosed in mg/24 hours  Place 1 patch onto the skin daily.     ondansetron 4 MG disintegrating tablet  Commonly known as:  ZOFRAN-ODT  Take 1 tablet (4 mg total) by mouth every 6 (six) hours as needed  for nausea.       Follow-up Information   Follow up with [facility]. (Walk-in clinics Monday-Friday 8am-12pm)    Contact information:   [address] [place], [place] [address]  [phone]      Please follow up. (Walk-in clinic 8:30am-12pm and 1pm-2:30pm)        The results of significant diagnostics from this hospitalization (including imaging, microbiology, ancillary and laboratory) are listed below for reference.    Significant Diagnostic Studies: Dg Chest Port 1 View  07/26/2012  *RADIOLOGY REPORT*  Clinical Data: Overdose  PORTABLE CHEST - 1 VIEW  Comparison: None.  Findings: Cardiomediastinal silhouette is unremarkable.  No acute infiltrate or pleural effusion.  No pulmonary edema.  Bony thorax is unremarkable.  IMPRESSION: No active disease.   Original Report Authenticated By: Natasha Mead, M.D.  Microbiology: Recent Results (from the past 240 hour(s))  MRSA PCR SCREENING     Status: None   Collection Time    07/26/12  5:58 PM      Result Value Range Status   MRSA by PCR NEGATIVE  NEGATIVE Final   Comment:            The GeneXpert MRSA Assay (FDA     approved for NASAL specimens     only), is one component of a     comprehensive MRSA colonization     surveillance program. It is not     intended to diagnose MRSA     infection nor to guide or     monitor treatment for     MRSA infections.     Labs: Basic Metabolic Panel:  Recent Labs Lab 07/26/12 1415 07/27/12 0334 07/28/12 0345  NA 139 137 140  K 3.6 3.4* 3.7  CL 107 106 108  CO2 23 26 25   GLUCOSE 79 98 102*  BUN 16 15 8   CREATININE 0.53 0.54 0.54  CALCIUM 8.2* 8.4 8.9   Liver Function Tests:  Recent Labs Lab 07/26/12 1415 07/27/12 0334  AST 17 17  ALT 13 13  ALKPHOS 48 45  BILITOT 0.5 0.3  PROT 6.1 5.5*  ALBUMIN 3.7 3.2*   No results found for this basename: LIPASE, AMYLASE,  in the last 168 hours No results found for this basename: AMMONIA,  in the last 168 hours CBC:  Recent Labs Lab  07/26/12 1415 07/27/12 0334 07/28/12 0345  WBC 9.7 8.2 7.3  NEUTROABS 6.7  --   --   HGB 12.0 11.2* 11.5*  HCT 35.5* 32.8* 33.2*  MCV 98.6 98.2 97.9  PLT 218 185 192   Cardiac Enzymes: No results found for this basename: CKTOTAL, CKMB, CKMBINDEX, TROPONINI,  in the last 168 hours BNP: BNP (last 3 results) No results found for this basename: PROBNP,  in the last 8760 hours CBG: No results found for this basename: GLUCAP,  in the last 168 hours  Additional labs:  Serum salicylate level <2  Serum acetaminophen level < 15   Urine pregnancy test: Negative    TSH: 1.557  UDS: Positive for benzodiazepines, opiates and THC  Blood alcohol level <11   Signed:  Milad Bublitz  [facility] 07/28/2012, 5:52 PM

## 2012-07-28 NOTE — Progress Notes (Addendum)
[facility] PROGRESS NOTE  Janice Saunders OZH:[id number] DOB: 06/29/1985 DOA: 07/26/2012 PCP: No primary provider on file.  Brief narrative 27 year old female with h/o Anxiety, depressions, substance abuse (uses street Oxy/Roxy and marijuana) and tobacco abuse was admitted on 07/26/2012 after EMS found her passed out in a car. She apparently had snorted heroin (claims first time). In the ER, she initially responded to Narcan but then again became somnolent and was admitted to step down unit on a Narcan drip.  Assessment/Plan: 1. Acute encephalopathy: Most likely secondary to drug overdose (heroin). Resolved. 2. Polysubstance abuse: Acute heroin OD, marijuana, tobacco and rarely alcohol. Off Narcan drip since 4/1 pm. No features of withdrawal. Cessation counseled. 3. Hypokalemia: repleted. 4. Anxiety and depression: Patient requested psychiatric consultation and admission to [facility] for management of this and opioid dependence. Psychiatry consulted and final recommendations pending. Continue Klonopin when necessary. Denies suicidal or homicidal ideations.  5. Chest pain, muscular/pleuritic: resolved 6. Anemia: stable 7. History of PID/HPV infection: Outpatient followup.  Addendum: Discussed with Dr.Jonnalagada, Psychiatry- please see consult note 07/27/12. He advises that patient is at risk for harm to self and children from substance abuse disorder. He recommends that patient cannot sign out AMA and if she tries to do so, she will need to be involuntarily committed. He has started her on clonidine protocol also recommends inpatient psychiatric care when [facility] is able to accept patients (Noro virus outbreak there recently). Same was discussed with patient's nurse in the ICU prior to transfer to floor.  Code Status: Full Family Communication: Discussed with patient. Disposition Plan: To be determined, pending psychiatry recommendations. We'll transfer to regular bed  today   Consultants:  Psychiatry  Procedures:  None  Antibiotics:  None   HPI/Subjective: Patient denies complaints. Does not complain of any pains or anxiety this morning. As per nursing, patient was unable to sleep last night, possibly because she slept a lot during the day yesterday. She received a dose of Ativan last night. Overnight, she also threatened to leave AMA because she wanted to go to [facility].  Objective: Filed Vitals:   07/27/12 2316 07/28/12 0000 07/28/12 0329 07/28/12 0400  BP: 117/58  138/65   Pulse:   89   Temp:  98.8 F (37.1 C)  98.1 F (36.7 C)  TempSrc:  Oral  Oral  Resp:   18   Height:      Weight:      SpO2:   98%     Intake/Output Summary (Last 24 hours) at 07/28/12 0714 Last data filed at 07/28/12 0600  Gross per 24 hour  Intake 1275.83 ml  Output   3550 ml  Net -2274.17 ml   Filed Weights   07/26/12 1800  Weight: 47.9 kg (105 lb 9.6 oz)    Exam:   General exam: Comfortable. Patient was sleeping but easily aroused.  Respiratory system: Clear. No increased work of breathing.  Cardiovascular system: S1 & S2 heard, RRR. No JVD, murmurs, gallops, clicks or pedal edema. Telemetry shows sinus rhythm.  Gastrointestinal system: Abdomen is nondistended, soft and nontender. Normal bowel sounds heard.  Central nervous system: Alert and oriented. No focal neurological deficits.  Extremities: Symmetric 5 x 5 power.  Psychiatric: Does not appear anxious.   Data Reviewed: Basic Metabolic Panel:  Recent Labs Lab 07/26/12 1415 07/27/12 0334 07/28/12 0345  NA 139 137 140  K 3.6 3.4* 3.7  CL 107 106 108  CO2 23 26 25   GLUCOSE 79 98 102*  BUN 16 15 8   CREATININE 0.53 0.54 0.54  CALCIUM 8.2* 8.4 8.9   Liver Function Tests:  Recent Labs Lab 07/26/12 1415 07/27/12 0334  AST 17 17  ALT 13 13  ALKPHOS 48 45  BILITOT 0.5 0.3  PROT 6.1 5.5*  ALBUMIN 3.7 3.2*   No results found for this basename: LIPASE, AMYLASE,  in the last 168  hours No results found for this basename: AMMONIA,  in the last 168 hours CBC:  Recent Labs Lab 07/26/12 1415 07/27/12 0334 07/28/12 0345  WBC 9.7 8.2 7.3  NEUTROABS 6.7  --   --   HGB 12.0 11.2* 11.5*  HCT 35.5* 32.8* 33.2*  MCV 98.6 98.2 97.9  PLT 218 185 192   Cardiac Enzymes: No results found for this basename: CKTOTAL, CKMB, CKMBINDEX, TROPONINI,  in the last 168 hours BNP (last 3 results) No results found for this basename: PROBNP,  in the last 8760 hours CBG: No results found for this basename: GLUCAP,  in the last 168 hours  Recent Results (from the past 240 hour(s))  MRSA PCR SCREENING     Status: None   Collection Time    07/26/12  5:58 PM      Result Value Range Status   MRSA by PCR NEGATIVE  NEGATIVE Final   Comment:            The GeneXpert MRSA Assay (FDA     approved for NASAL specimens     only), is one component of a     comprehensive MRSA colonization     surveillance program. It is not     intended to diagnose MRSA     infection nor to guide or     monitor treatment for     MRSA infections.     Studies: Dg Chest Port 1 View  07/26/2012  *RADIOLOGY REPORT*  Clinical Data: Overdose  PORTABLE CHEST - 1 VIEW  Comparison: None.  Findings: Cardiomediastinal silhouette is unremarkable.  No acute infiltrate or pleural effusion.  No pulmonary edema.  Bony thorax is unremarkable.  IMPRESSION: No active disease.   Original Report Authenticated By: Natasha Mead, M.D.      Additional labs:   Scheduled Meds: . LORazepam  0.5 mg Intravenous Once  . nicotine  21 mg Transdermal Daily  . sodium chloride  3 mL Intravenous Q12H   Continuous Infusions: . sodium chloride 50 mL/hr at 07/27/12 1610    Principal Problem:   Accidental drug overdose Active Problems:   Anxiety   Depression   PID (acute pelvic inflammatory disease)   HPV (human papilloma virus) anogenital infection   Acute encephalopathy   Hypokalemia    Time spent: 30  minutes    [facility]  [facility] Pager [phone].   If 8PM-8AM, please contact night-coverage at [email or web address], password [facility] 07/28/2012, 7:14 AM  LOS: 2 days

## 2012-07-29 MED ORDER — CHLORDIAZEPOXIDE HCL 25 MG PO CAPS
25.0000 mg | ORAL_CAPSULE | Freq: Four times a day (QID) | ORAL | Status: AC | PRN
  Administered 2012-07-29 – 2012-07-30 (×6): 25 mg via ORAL
  Filled 2012-07-29 (×6): qty 1

## 2012-07-29 MED ORDER — SERTRALINE HCL 25 MG PO TABS
25.0000 mg | ORAL_TABLET | Freq: Every day | ORAL | Status: DC
  Administered 2012-07-29: 25 mg via ORAL
  Filled 2012-07-29 (×6): qty 1

## 2012-07-29 NOTE — [facility] Group Notes (Signed)
[facility] LCSW Group Therapy        Feelings Around Relapse        1:15-2:30 PM    07/29/2012 3:03 PM  Type of Therapy:  Group Therapy  Participation Level:  Active  Participation Quality:  Appropriate  Affect:  Appropriate, Depressed and Labile  Cognitive:  Appropriate  Insight:  Engaged  Engagement in Therapy:  Engaged  Modes of Intervention:  Discussion, Education, Exploration, Problem-solving, Rapport Building and Support  Summary of Progress/Problems:  Patient reports relapse for her means returning home with no motivation to carry out daily activities.  She shared she is looking forward to addressing problems she has not dealt with in the past.  Wynn Banker 07/29/2012, 3:03 PM

## 2012-07-29 NOTE — Progress Notes (Signed)
Patient ID: Janice Saunders, female   DOB: [birth year], 27 y.o.   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 She stated she was anxious. A: after probing the pt she stated she has been on clonopin. she also stated that she has ADD.  Advised her that she needs to talk to the MD about resuming clonopin here at this facility and making him aware of her ADD. R: she stated that she would speak with the MD about those concerns. She denied any SI/hi/av. RN will monitor and Q 15 min ck's continue.

## 2012-07-29 NOTE — [facility] Group Notes (Signed)
[facility] LCSW Aftercare Discharge Planning Group Note   07/29/2012 2:42 PM  Participation Quality:  Appropriate  Mood/Affect:  Appropriate, Depressed and Labile  Depression Rating:  Patient stated she is unable to rate depression due to being used to hiding the fact that she is depressed  Anxiety Rating:  Depression rated at ten.  Thoughts of Suicide:  No  Will you contract for safety?   NA  Current AVH:  No  Plan for Discharge/Comments:    Patient plans to return to her father's home at discharge.  She will need a referral to [facility] [address] in [facility] where she lives.  Transportation Means: Family will transport home.  Supports:  She reports having good family support.  Jelisha Weed, Joesph July
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
## 2012-07-29 NOTE — [facility] Suicide Risk Assessment (Signed)
[facility] INPATIENT:  Family/Significant Other Suicide Prevention Education  Suicide Prevention Education:  Patient Refusal for Family/Significant Other Suicide Prevention Education: The patient Janice Saunders has refused to provide written consent for family/significant other to be provided Family/Significant Other Suicide Prevention Education during admission and/or prior to discharge.  Physician notified.  Wynn Banker 07/29/2012, 2:28 PM
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
## 2012-07-29 NOTE — Progress Notes (Signed)
Adult Psychoeducational Group Note  Date:  07/29/2012 Time:  12:15 PM  Group Topic/Focus:  Early Warning Signs:   The focus of this group is to help patients identify signs or symptoms they exhibit before slipping into an unhealthy state or crisis.  Participation Level:  Active  Participation Quality:  Attentive  Affect:  Appropriate  Cognitive:  Alert  Insight: Good  Engagement in Group:  Engaged  Modes of Intervention:  Education  Additional Comments:  Patient was able to give her views on how when recovering the first thing you have to start with is yourself.  Casilda Carls 07/29/2012, 12:15 PM

## 2012-07-30 DIAGNOSIS — T401X1A Poisoning by heroin, accidental (unintentional), initial encounter: Secondary | ICD-10-CM

## 2012-07-30 DIAGNOSIS — F1110 Opioid abuse, uncomplicated: Principal | ICD-10-CM

## 2012-07-30 MED ORDER — GABAPENTIN 100 MG PO CAPS
100.0000 mg | ORAL_CAPSULE | Freq: Three times a day (TID) | ORAL | Status: DC
  Administered 2012-07-30 – 2012-08-01 (×6): 100 mg via ORAL
  Filled 2012-07-30 (×10): qty 1

## 2012-07-30 NOTE — [facility] Group Notes (Signed)
[facility] LCSW Group Therapy  07/30/2012 10am  Type of Therapy:  Group Therapy  Participation Level:  Active  Participation Quality:  Attentive, Inattentive and Sharing  Affect:  Blunted  Cognitive:  Appropriate  Insight:  Developing/Improving  Engagement in Therapy:  Engaged  Modes of Intervention:  Exploration  Summary of Progress/Problems:  The main focus of today's process group was for the patient to identify ways in which they have in the past sabotaged their own recovery. Motivational Interviewing was utilized to ask the group members what they get out of their substance use, and how their behavior interferes with who they want to be.  The Stages of Change were explained, and members rated their motivation to change on a scale of 0 (no desire) to 10 (completely committed).  The patient expressed frustration throughout group at having at a very young age the responsibilities of taking care of so many others.  Her motivation to change is 10 out of 10.   Sarina Ser 07/30/2012, 12:55 PM

## 2012-07-30 NOTE — Progress Notes (Signed)
Patient ID: Janice Saunders, female   DOB: [birth year], 27 y.o.   MRN: [id number] D:  Patient in bed with eyes closed. Respirations even and non-labored. A: Staff will monitor on q 15 minute checks, follow treatment plan, and give meds as ordered. R: Appears asleep. No response from patient due to sleeping.

## 2012-07-30 NOTE — Progress Notes (Signed)
D Marijean Niemann is OOB UAL on the 300 hall today...tolerated fair. SHe is appropriate, she interacts well with the staff as well as her peers. SHe is attentive during group and  she asks appropriate questions regarding her POC, treatment and her meds.    A She completed her AM slef inventory and on it she wrote she denied SI within the past 24 hrs, she rated her depression and hopelessness " 3 / 2 " and she stated her DC plan is to attend " counseling and getting on my correct meds".    R Safety is in place and POC contd.

## 2012-07-30 NOTE — Progress Notes (Signed)
[facility] MD Progress Note  07/30/2012 3:47 PM Janice Saunders  MRN:  [id number]  Subjective: "I'm not doing well. I just can't focus. I need to be put back on Vyvanse. I have not been on it in 6 months. I stopped taking it because I was not able to afford it. But I'm about to be approved for medicaid. This time I will be able to stay on it and afford it too. I don't know why everyone thought that I was trying to kill myself. I was not, the very first time that I tried Heroin, I accidentally overdosed on it. It was my husband who made me use it. I need Vyvanse to help me learn the things that are being taught during groups sessions"   Diagnosis:   Axis I: Heroin abuse, Heroin overdose Axis II: Deferred Axis III:  Past Medical History  Diagnosis Date  . Abnormal Pap smear   . HPV (human papilloma virus) anogenital infection   . Depression   . Anxiety   . PID (acute pelvic inflammatory disease)    Axis IV: other psychosocial or environmental problems and Substance abuse issues Axis V: 41-50 serious symptoms  ADL's:  Intact  Sleep: Fair  Appetite:  Fair  Suicidal Ideation:  Plan:  Denies Intent:  Denies Means:  Denies Homicidal Ideation:  Plan:  Denies Intent:  Denies Means:  Denies AEB (as evidenced by):  Psychiatric Specialty Exam: Review of Systems  Constitutional: Negative.   HENT: Negative.   Eyes: Negative.   Respiratory: Negative.   Cardiovascular: Negative.   Genitourinary: Negative.   Skin: Negative.   Neurological: Negative.   Endo/Heme/Allergies: Negative.   Psychiatric/Behavioral: Positive for depression and substance abuse. Negative for suicidal ideas, hallucinations and memory loss. The patient is nervous/anxious and has insomnia.     Blood pressure 104/69, pulse 98, temperature 97.7 F (36.5 C), temperature source Oral, resp. rate 16, height 5\' 3"  (1.6 m), weight 46.72 kg (103 lb).Body mass index is 18.25 kg/(m^2).  General Appearance: Casual and thin frame   Eye Contact::  Good  Speech:  Clear and Coherent  Volume:  Normal  Mood:  Anxious, Depressed and Irritable  Affect:  Flat and Tearful  Thought Process:  Circumstantial  Orientation:  Full (Time, Place, and Person)  Thought Content:  Rumination  Suicidal Thoughts:  No  Homicidal Thoughts:  No  Memory:  Immediate;   Good Recent;   Good Remote;   Good  Judgement:  Impaired  Insight:  Shallow  Psychomotor Activity:  Restlessness  Concentration:  Poor  Recall:  Fair  Akathisia:  No  Handed:  Right  AIMS (if indicated):     Assets:  Desire for Improvement  Sleep:  Number of Hours: 5.75   Current Medications: Current Facility-Administered Medications  Medication Dose Route Frequency Provider Last Rate Last Dose  . acetaminophen (TYLENOL) tablet 650 mg  650 mg Oral Q6H PRN Karolee Stamps, NP      . alum & mag hydroxide-simeth (MAALOX/MYLANTA) 200-200-20 MG/5ML suspension 30 mL  30 mL Oral Q4H PRN Karolee Stamps, NP      . chlordiazePOXIDE (LIBRIUM) capsule 25 mg  25 mg Oral Q6H PRN Rachael Fee, MD   25 mg at 07/30/12 1427  . cloNIDine (CATAPRES) tablet 0.1 mg  0.1 mg Oral QID Karolee Stamps, NP   0.1 mg at 07/30/12 1150   Followed by  . [START ON 07/31/2012] cloNIDine (CATAPRES) tablet 0.1 mg  0.1 mg Oral BH-qamhs  Karolee Stamps, NP   0.1 mg at 07/30/12 0844   Followed by  . [START ON 08/02/2012] cloNIDine (CATAPRES) tablet 0.1 mg  0.1 mg Oral QAC breakfast Karolee Stamps, NP      . dicyclomine (BENTYL) tablet 20 mg  20 mg Oral Q6H PRN Karolee Stamps, NP      . hydrOXYzine (ATARAX/VISTARIL) tablet 25 mg  25 mg Oral Q6H PRN Karolee Stamps, NP   25 mg at 07/29/12 2002  . loperamide (IMODIUM) capsule 2-4 mg  2-4 mg Oral PRN Karolee Stamps, NP      . magnesium hydroxide (MILK OF MAGNESIA) suspension 30 mL  30 mL Oral Daily PRN Karolee Stamps, NP      . methocarbamol (ROBAXIN) tablet 500 mg  500 mg Oral Q8H PRN Karolee Stamps, NP      . naproxen (NAPROSYN) tablet 500 mg  500 mg  Oral BID PRN Karolee Stamps, NP   500 mg at 07/28/12 2109  . nicotine (NICODERM CQ - dosed in mg/24 hours) patch 21 mg  21 mg Transdermal Daily Karolee Stamps, NP   21 mg at 07/29/12 0742  . ondansetron (ZOFRAN-ODT) disintegrating tablet 4 mg  4 mg Oral Q6H PRN Karolee Stamps, NP      . polyethylene glycol [facility] / GLYCOLAX) packet 17 g  17 g Oral Daily PRN Karolee Stamps, NP      . sertraline (ZOLOFT) tablet 25 mg  25 mg Oral Daily Himabindu Ravi, MD   25 mg at 07/29/12 1134  . traZODone (DESYREL) tablet 50 mg  50 mg Oral QHS PRN,MR X 1 Karolee Stamps, NP   50 mg at 07/29/12 2317    Lab Results: No results found for this or any previous visit (from the past 48 hour(s)).  Physical Findings: AIMS: Facial and Oral Movements Muscles of Facial Expression: None, normal Lips and Perioral Area: None, normal Jaw: None, normal Tongue: None, normal,Extremity Movements Upper (arms, wrists, hands, fingers): None, normal Lower (legs, knees, ankles, toes): None, normal, Trunk Movements Neck, shoulders, hips: None, normal, Overall Severity Severity of abnormal movements (highest score from questions above): None, normal Incapacitation due to abnormal movements: None, normal Patient's awareness of abnormal movements (rate only patient's report): No Awareness, Dental Status Current problems with teeth and/or dentures?: No Does patient usually wear dentures?: No  CIWA:    COWS:  COWS Total Score: 3  Treatment Plan Summary: Daily contact with patient to assess and evaluate symptoms and progress in treatment Medication management  Plan: Supportive approach/coping skills/relapse prevention. Declined to give or prescribe Vyvanse at this time, patient has not taken in 6 months. Initiate Neurontin 100 mg tid for anxiety Encouraged out of room, participation in group sessions and application of coping skills when distressed. Will continue to monitor response to/adverse effects of medications in use  to assure effectiveness. Continue to monitor mood, behavior and interaction with staff and other patients. Continue current plan of care.  Medical Decision Making Problem Points:  Established problem, stable/improving (1), Review of last therapy session (1) and Review of psycho-social stressors (1) Data Points:  Review of medication regiment & side effects (2) Review of new medications or change in dosage (2)  I certify that inpatient services furnished can reasonably be expected to improve the patient's condition.   Armandina Stammer I 07/30/2012, 3:47 PM

## 2012-07-31 NOTE — Progress Notes (Signed)
D.  Pt anxious and tearful on approach, requested anti-anxiety medication before attended evening AA group.  Denies signs or symptoms of withdrawal at this time.  Denies SI/HI/hallucinations at this time.  Interacting appropriately within milieu.  Complaints of body aches due to having not received her birth control shot.  States she will get shot tomorrow as she expects to get discharged.   A.  Support and encouragement offered  Medication given as ordered for body aches  R.  Pt remains safe on unit, will continue to monitor.

## 2012-07-31 NOTE — Progress Notes (Signed)
D Janice Saunders is progressing nicely, attending her groups, doing her journaling in her SUnday patient workbook and interacting with her peers appropriately. SHe attends her groups, is engaged in her treatment plan abnd proud that she is not asking for medictions ( prn's ) as much as she once was.   A She completes her AM self inventory and on it she writes she denies SI within the past 24 hrs, she rates her depression and hopelessness " 1 / 1 " and states her DC plan is " counseling, and continuing the meds that are needed and spending more time with positive people".   R Safety is in place and POC cinludes fostering therapeutic relationship.

## 2012-07-31 NOTE — Progress Notes (Signed)
Psychoeducational Group Note  Date:  07/31/2012 Time: 1015 Group Topic/Focus:  Making Healthy Choices:   The focus of this group is to help patients identify negative/unhealthy choices they were using prior to admission and identify positive/healthier coping strategies to replace them upon discharge.  Participation Level:  Active  Participation Quality:  Appropriate  Affect:  Appropriate  Cognitive:  Appropriate  Insight:  Engaged  Engagement in Group:  Monopolizing  Additional Comments:    Rich Brave 4:11 PM. 07/31/2012

## 2012-07-31 NOTE — Progress Notes (Signed)
[facility] Group Notes:  (Nursing/MHT/Case Management/Adjunct)  Date:  07/30/2012   Time:  2100 Type of Therapy:  wrap up group  Participation Level:  Active  Participation Quality:  Appropriate, Attentive, Sharing and Supportive  Affect:  Appropriate  Cognitive:  Alert and Appropriate  Insight:  Good  Engagement in Group:  Engaged  Modes of Intervention:  Clarification, Education and Support  Summary of Progress/Problems: Pt reports wanting to start coping with what happened to her. Go to counseling and talk more to people. She also wants to surround herself with positive people and "take care of me".   Shelah Lewandowsky 07/31/2012, 4:13 AM

## 2012-07-31 NOTE — [facility] Group Notes (Signed)
[facility] LCSW Group Therapy  07/31/2012 10:00-11:00am  Summary of Progress/Problems: The main focus of today's process group was to identify the patient's current support system and decide on other supports that can be put in place. Four definitions/levels of support were discussed and an exercise was utilized to show how much stronger we become with additional supports providing accountability, improved physical and emotional health, and better problem-solving skills. An emphasis was placed on using counselor, doctor, therapy groups, 12-step groups, and problem-specific support groups to expand supports. The patient expressed understanding of the concepts presented in the group, an ability to increase her supports and a willingness to do so.  Type of Therapy:  Group Therapy  Participation Level:  Active  Participation Quality:  Appropriate, Attentive and Sharing  Affect:  Blunted  Cognitive:  Alert, Appropriate and Oriented  Insight:  Engaged  Engagement in Therapy:  Engaged  Modes of Intervention:  Education, Exploration and Support   Sarina Ser 07/31/2012, 12:23 PM

## 2012-07-31 NOTE — Progress Notes (Signed)
Patient ID: Janice Saunders, female   DOB: [birth year], 27 y.o.   MRN: [id number]  D: Patient pleasant on approach tonight. Reports not able to sleep from 1st trazodone so repeat dose given. Patient wanting librium due to anxiety from her withdrawals. No SI, HI or a/v hallucinations. A: Staff will monitor on q 15 minute checks, follow treatment plan, and give meds as ordered. R: Took meds then went to room to attempt to sleep tonight.

## 2012-07-31 NOTE — Progress Notes (Signed)
Patient ID: Janice Saunders, female   DOB: 01-27-1986, 27 y.o.   MRN: [id number] [facility] MD Progress Note  07/31/2012 3:17 PM Janice Saunders  MRN:  [id number]  Subjective: "I feel good today.  The Neurontin helped me a lot. I woke up feeling better today. Coming to this hospital has been great. I have to say that coming off of the shit that I have been taken is great. But it did not feel right going to through the withdrawal symptoms. I need my Depot shot restarted or I will start my period here. I don't want to have to go through with that".  Diagnosis:   Axis I: Heroin abuse, Heroin overdose Axis II: Deferred Axis III:  Past Medical History  Diagnosis Date  . Abnormal Pap smear   . HPV (human papilloma virus) anogenital infection   . Depression   . Anxiety   . PID (acute pelvic inflammatory disease)    Axis IV: other psychosocial or environmental problems and Substance abuse issues Axis V: 41-50 serious symptoms  ADL's:  Intact  Sleep: Fair  Appetite:  Fair  Suicidal Ideation:  Plan:  Denies Intent:  Denies Means:  Denies Homicidal Ideation:  Plan:  Denies Intent:  Denies Means:  Denies AEB (as evidenced by):  Psychiatric Specialty Exam: Review of Systems  Constitutional: Negative.   HENT: Negative.   Eyes: Negative.   Respiratory: Negative.   Cardiovascular: Negative.   Genitourinary: Negative.   Skin: Negative.   Neurological: Negative.   Endo/Heme/Allergies: Negative.   Psychiatric/Behavioral: Positive for depression and substance abuse. Negative for suicidal ideas, hallucinations and memory loss. The patient is nervous/anxious and has insomnia.     Blood pressure 93/57, pulse 103, temperature 97 F (36.1 C), temperature source Oral, resp. rate 20, height 5\' 3"  (1.6 m), weight 46.72 kg (103 lb).Body mass index is 18.25 kg/(m^2).  General Appearance: Casual and thin frame  Eye Contact::  Good  Speech:  Clear and Coherent  Volume:  Normal  Mood:  Anxious, Depressed  and Irritable  Affect:  Flat and Tearful  Thought Process:  Circumstantial  Orientation:  Full (Time, Place, and Person)  Thought Content:  Rumination  Suicidal Thoughts:  No  Homicidal Thoughts:  No  Memory:  Immediate;   Good Recent;   Good Remote;   Good  Judgement:  Impaired  Insight:  Shallow  Psychomotor Activity:  Restlessness  Concentration:  Poor  Recall:  Fair  Akathisia:  No  Handed:  Right  AIMS (if indicated):     Assets:  Desire for Improvement  Sleep:  Number of Hours: 5.25   Current Medications: Current Facility-Administered Medications  Medication Dose Route Frequency Provider Last Rate Last Dose  . acetaminophen (TYLENOL) tablet 650 mg  650 mg Oral Q6H PRN Karolee Stamps, NP      . alum & mag hydroxide-simeth (MAALOX/MYLANTA) 200-200-20 MG/5ML suspension 30 mL  30 mL Oral Q4H PRN Karolee Stamps, NP      . chlordiazePOXIDE (LIBRIUM) capsule 25 mg  25 mg Oral Q6H PRN Rachael Fee, MD   25 mg at 07/30/12 2347  . cloNIDine (CATAPRES) tablet 0.1 mg  0.1 mg Oral BH-qamhs Sanjuana Kava, NP   0.1 mg at 07/31/12 0756   Followed by  . [START ON 08/02/2012] cloNIDine (CATAPRES) tablet 0.1 mg  0.1 mg Oral QAC breakfast Sanjuana Kava, NP      . dicyclomine (BENTYL) tablet 20 mg  20 mg Oral Q6H PRN Vernona Rieger  Concepcion Elk, NP      . gabapentin (NEURONTIN) capsule 100 mg  100 mg Oral TID Sanjuana Kava, NP   100 mg at 07/31/12 1202  . hydrOXYzine (ATARAX/VISTARIL) tablet 25 mg  25 mg Oral Q6H PRN Karolee Stamps, NP   25 mg at 07/31/12 0802  . loperamide (IMODIUM) capsule 2-4 mg  2-4 mg Oral PRN Karolee Stamps, NP      . magnesium hydroxide (MILK OF MAGNESIA) suspension 30 mL  30 mL Oral Daily PRN Karolee Stamps, NP      . methocarbamol (ROBAXIN) tablet 500 mg  500 mg Oral Q8H PRN Karolee Stamps, NP      . naproxen (NAPROSYN) tablet 500 mg  500 mg Oral BID PRN Karolee Stamps, NP   500 mg at 07/28/12 2109  . nicotine (NICODERM CQ - dosed in mg/24 hours) patch 21 mg  21 mg  Transdermal Daily Karolee Stamps, NP   21 mg at 07/31/12 0757  . ondansetron (ZOFRAN-ODT) disintegrating tablet 4 mg  4 mg Oral Q6H PRN Karolee Stamps, NP      . polyethylene glycol [facility] / GLYCOLAX) packet 17 g  17 g Oral Daily PRN Karolee Stamps, NP      . sertraline (ZOLOFT) tablet 25 mg  25 mg Oral Daily Himabindu Ravi, MD   25 mg at 07/29/12 1134  . traZODone (DESYREL) tablet 50 mg  50 mg Oral QHS PRN,MR X 1 Karolee Stamps, NP   50 mg at 07/30/12 2347    Lab Results: No results found for this or any previous visit (from the past 48 hour(s)).  Physical Findings: AIMS: Facial and Oral Movements Muscles of Facial Expression: None, normal Lips and Perioral Area: None, normal Jaw: None, normal Tongue: None, normal,Extremity Movements Upper (arms, wrists, hands, fingers): None, normal Lower (legs, knees, ankles, toes): None, normal, Trunk Movements Neck, shoulders, hips: None, normal, Overall Severity Severity of abnormal movements (highest score from questions above): None, normal Incapacitation due to abnormal movements: None, normal Patient's awareness of abnormal movements (rate only patient's report): No Awareness, Dental Status Current problems with teeth and/or dentures?: No Does patient usually wear dentures?: No  CIWA:  CIWA-Ar Total: 1 COWS:  COWS Total Score: 3  Treatment Plan Summary: Daily contact with patient to assess and evaluate symptoms and progress in treatment Medication management  Plan: Supportive approach/coping skills/relapse prevention. Declined to give or prescribe Vyvanse at this time, patient has not taken in 6 months. Continue Neurontin 100 mg tid for anxiety. Depot shots. Encouraged out of room, participation in group sessions and application of coping skills when distressed. Will continue to monitor response to/adverse effects of medications in use to assure effectiveness. Continue to monitor mood, behavior and interaction with staff and other  patients. Continue current plan of care.  Medical Decision Making Problem Points:  Established problem, stable/improving (1), Review of last therapy session (1) and Review of psycho-social stressors (1) Data Points:  Review of medication regiment & side effects (2) Review of new medications or change in dosage (2)  I certify that inpatient services furnished can reasonably be expected to improve the patient's condition.   Armandina Stammer I 07/31/2012, 3:17 PM

## 2012-07-31 NOTE — Progress Notes (Signed)
Thankfullness  Group Note  Date: 07/31/2012 Time: 0900  Group Topic/Focus:  Identifying things to be thankful for :   The focus of this group is to help patients identify  Things / places / events in their lives that they are thankful for..  Participation Level:  Active  Participation Quality:  Appropriate  Affect:  Appropriate  Cognitive:  Appropriate  Insight:  Engaged  Engagement in Group:  Engaged  Additional Comments:   07/31/2012,9:35 AM Rich Brave

## 2012-08-01 DIAGNOSIS — F1910 Other psychoactive substance abuse, uncomplicated: Secondary | ICD-10-CM

## 2012-08-01 DIAGNOSIS — F1994 Other psychoactive substance use, unspecified with psychoactive substance-induced mood disorder: Secondary | ICD-10-CM

## 2012-08-01 MED ORDER — SERTRALINE HCL 25 MG PO TABS: 25.0000 mg | ORAL_TABLET | Freq: Every day | ORAL | Status: DC

## 2012-08-01 MED ORDER — GABAPENTIN 100 MG PO CAPS: 100.0000 mg | ORAL_CAPSULE | Freq: Three times a day (TID) | ORAL | Status: DC

## 2012-08-01 MED ORDER — TRAZODONE HCL 50 MG PO TABS: 50.0000 mg | ORAL_TABLET | Freq: Every evening | ORAL | Status: DC | PRN

## 2012-08-01 NOTE — [facility] Group Notes (Signed)
[facility] LCSW Group Therapy  08/01/2012 4:29 PM  Type of Therapy:  Group Therapy  Participation Level:  Active  Participation Quality:  Attentive and Sharing  Affect:  Anxious  Cognitive:  Alert and Oriented  Insight:  Developing/Improving  Engagement in Therapy:  Engaged  Modes of Intervention:  Clarification, Confrontation, Discussion, Education, Exploration, Limit-setting, Orientation, Problem-solving, Rapport Building, Dance movement psychotherapist, Socialization and Support  Summary of Progress/Problems: The topic for group today was overcoming obstacles.  Pt discussed overcoming obstacles and what this means for pt. Pt stated that her obstacle is a negative relationship with her husband. Pt reported that upon her discharge she will limit interaction with him and potentially file for a restraining order. Pt reported to have a great support system at home and that she realizes she does not have control over her spouse. "I can't fix him" per pt. Pt demonstrated improved mood and affect as she disclosed her desire to follow up with counseling upon her discharge.   Janann Colonel C 08/01/2012, 4:29 PM

## 2012-08-01 NOTE — Progress Notes (Signed)
Patient ID: Janice Saunders, female   DOB: [birth year], 27 y.o.   MRN: [id number] Chart was noted on 07/29/12 that patient refused to sign consent for suicide prevention education. Writer provided suicide prevention education directly to patient; conversation included risk factors, warning signs and resources to contact for help. Mobile crisis services explained and contact card placed in chart for pt to receive at discharge. [name], LCSWA 08/01/2012 2:35 PM

## 2012-08-01 NOTE — Tx Team (Signed)
Interdisciplinary Treatment Plan Update (Adult)  Date: 08/01/2012  Time Reviewed: 9:56 AM   Progress in Treatment: Attending groups: Yes Participating in groups: Yes Taking medication as prescribed:  Yes Tolerating medication:  Yes Family/Significant othe contact made: No Patient understands diagnosis: Yes Discussing patient identified problems/goals with staff: Yes Medical problems stabilized or resolved:  Yes Denies suicidal/homicidal ideation: Yes Patient has not harmed self or Others: Yes  New problem(s) identified: None Identified  Discharge Plan or Barriers:  CSW is assessing for appropriate referrals.   Additional comments: N/A  Reason for Continuation of Hospitalization: NA  Estimated length of stay: Discharge today  For review of initial/current patient goals, please see plan of care.  Attendees: Patient:  Janice Saunders 08/01/2012 10:40 AM  Physician:  Geoffery Lyons 08/01/2012 10:40 AM   Nursing:    08/01/2012 10:40 AM   Clinical Social Worker Ronda Fairly 08/01/2012 10:40 AM   Other:  Berneice Heinrich, RN 08/01/2012 10:40 AM   Other:  Georgina Quint, [place] PA Student 08/01/2012 10:40 AM   Other:     Other:      Scribe for Treatment Team:   [name], LCSWA  08/01/2012 10:40 AM

## 2012-08-01 NOTE — Progress Notes (Signed)
Patient did attend the evening speaker AA meeting.  

## 2012-08-01 NOTE — Progress Notes (Signed)
Patient denies SI/HI, denies A/V hallucinations. Patient verbalizes understanding of discharge instructions, follow up care and prescriptions. Patient given all belongings from [facility] locker. Patient escorted out by staff, transported by family. 
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
## 2012-08-01 NOTE — BHH Group Notes (Signed)
[facility] LCSW Aftercare Discharge Planning Group Note   08/01/2012 8:45 AM  Participation Quality:    Mood/Affect:  Appropriate  Depression Rating:  0  Anxiety Rating:  0  Thoughts of Suicide:  No  Will you contract for safety?   NA  Current AVH:  Negative  Plan for Discharge/Comments:  Patient will be living with either mother or father (back and forth between the two) and following up with [facility] in [place], [place].   Transportation Means: Father or best friend who is sober support will pick up patient at DC  Supports: Parents, Best friend, Sister and willing to add new supports through [employer] and NA Meetings  Dyane Dustman, Julious Payer
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
## 2012-08-01 NOTE — Progress Notes (Signed)
[facility] Adult Case Management Discharge Plan :  Will you be returning to the same living situation after discharge: No.Will be living with either Mother or Father verses husband At discharge, do you have transportation home?:Yes,  either father or supportive friend Do you have the ability to pay for your medications:Yes,  through [place] and or [facility]  Release of information consent forms completed and in the chart;  Patient's signature needed at discharge.  Patient to Follow up at: Follow-up Information   Follow up with [facility] On 08/02/2012. (Walk in appointment due to being a new patient. Please arrive promptly at 8am  (walk in hours 8-3pm M-F)  This is for follow up: medication and therapy)    Contact information:   [address] [place], [place] [address] [phone] Fax [phone]      Patient denies SI/HI:   Yes,  denies both    Safety Planning and Suicide Prevention discussed:  Yes,  with patient  Clide Dales 08/01/2012, 3:01 PM

## 2012-08-01 NOTE — Progress Notes (Signed)
Adult Psychoeducational Group Note  Date:  08/01/2012 Time:  1:44 PM  Group Topic/Focus:  Self Care:   The focus of this group is to help patients understand the importance of self-care in order to improve or restore emotional, physical, spiritual, interpersonal, and financial health.  Participation Level:  Active  Participation Quality:  Appropriate  Affect:  Appropriate  Cognitive:  Appropriate  Insight: Appropriate  Engagement in Group:  Engaged  Modes of Intervention:  Discussion  Additional Comments:  Pt was appropriate and attentive while attending group. Pt shared that she schedules regular activities with her children and wants to do better at asking for help.   Sharyn Lull 08/01/2012, 1:44 PM
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
## 2012-08-04 NOTE — Progress Notes (Signed)
Patient Discharge Instructions:  After Visit Summary (AVS):   Faxed to:  08/04/12 Discharge Summary Note:   Faxed to:  08/04/12 Psychiatric Admission Assessment Note:   Faxed to:  08/04/12 Suicide Risk Assessment - Discharge Assessment:   Faxed to:  08/04/12 Faxed/Sent to the Next Level Care provider:  08/04/12 Faxed to [facility] @ [phone]  Jerelene Redden, 08/04/2012, 4:15 PM

## 2013-05-05 ENCOUNTER — Emergency Department (HOSPITAL_BASED_OUTPATIENT_CLINIC_OR_DEPARTMENT_OTHER): Payer: Medicaid Other

## 2013-05-05 ENCOUNTER — Encounter (HOSPITAL_BASED_OUTPATIENT_CLINIC_OR_DEPARTMENT_OTHER): Payer: Self-pay | Admitting: Emergency Medicine

## 2013-05-05 ENCOUNTER — Emergency Department (HOSPITAL_BASED_OUTPATIENT_CLINIC_OR_DEPARTMENT_OTHER)
Admission: EM | Admit: 2013-05-05 | Discharge: 2013-05-05 | Disposition: A | Discharge status: Home / Self Care | Payer: Medicaid Other | Attending: Emergency Medicine | Admitting: Emergency Medicine

## 2013-05-05 DIAGNOSIS — Z8701 Personal history of pneumonia (recurrent): Secondary | ICD-10-CM | POA: Insufficient documentation

## 2013-05-05 DIAGNOSIS — F329 Major depressive disorder, single episode, unspecified: Secondary | ICD-10-CM | POA: Insufficient documentation

## 2013-05-05 DIAGNOSIS — M549 Dorsalgia, unspecified: Secondary | ICD-10-CM | POA: Insufficient documentation

## 2013-05-05 DIAGNOSIS — J069 Acute upper respiratory infection, unspecified: Secondary | ICD-10-CM | POA: Insufficient documentation

## 2013-05-05 DIAGNOSIS — Z8619 Personal history of other infectious and parasitic diseases: Secondary | ICD-10-CM | POA: Insufficient documentation

## 2013-05-05 DIAGNOSIS — Z8742 Personal history of other diseases of the female genital tract: Secondary | ICD-10-CM | POA: Insufficient documentation

## 2013-05-05 DIAGNOSIS — F3289 Other specified depressive episodes: Secondary | ICD-10-CM | POA: Insufficient documentation

## 2013-05-05 DIAGNOSIS — F17200 Nicotine dependence, unspecified, uncomplicated: Secondary | ICD-10-CM | POA: Insufficient documentation

## 2013-05-05 DIAGNOSIS — Z79899 Other long term (current) drug therapy: Secondary | ICD-10-CM | POA: Insufficient documentation

## 2013-05-05 DIAGNOSIS — R0789 Other chest pain: Secondary | ICD-10-CM | POA: Insufficient documentation

## 2013-05-05 DIAGNOSIS — F411 Generalized anxiety disorder: Secondary | ICD-10-CM | POA: Insufficient documentation

## 2013-05-05 HISTORY — DX: Pneumonia, unspecified organism: J18.9

## 2013-05-05 MED ORDER — HYDROCODONE-HOMATROPINE 5-1.5 MG/5ML PO SYRP: 5.0000 mL | ORAL_SOLUTION | Freq: Four times a day (QID) | ORAL | Status: DC | PRN

## 2013-05-05 NOTE — ED Notes (Signed)
Pt reports a 3 day history of fever, cough, chest and back pain.

## 2013-05-05 NOTE — Discharge Instructions (Signed)
Take hycodan as needed for cough. Refer to attached documents for more information. Return to the ED with worsening or concerning symptoms.  °

## 2013-05-05 NOTE — ED Provider Notes (Signed)
CSN: [id number]     Arrival date & time 05/05/13  1151 History   First MD Initiated Contact with Patient 05/05/13 1208     Chief Complaint  Patient presents with  . Fever  . Cough   (Consider location/radiation/quality/duration/timing/severity/associated sxs/prior Treatment) Patient is a 28 y.o. female presenting with fever and cough. The history is provided by the patient. No language interpreter was used.  Fever Max temp prior to arrival:  Unsure Temp source:  Subjective Severity:  Moderate Onset quality:  Gradual Duration:  3 days Timing:  Constant Progression:  Unchanged Chronicity:  New Relieved by:  Nothing Worsened by:  Nothing tried Ineffective treatments:  None tried Associated symptoms: chest pain and cough   Associated symptoms: no chills, no diarrhea, no dysuria, no nausea and no vomiting   Chest pain:    Quality:  Aching   Severity:  Moderate   Onset quality:  Gradual   Duration:  3 days   Timing:  Constant   Progression:  Unchanged   Chronicity:  New Cough:    Cough characteristics:  Hacking   Severity:  Moderate   Onset quality:  Gradual   Duration:  3 days   Timing:  Constant   Progression:  Unchanged   Chronicity:  New Risk factors: sick contacts   Risk factors: no hx of cancer, no immunosuppression and no recent surgery   Cough Associated symptoms: chest pain and fever   Associated symptoms: no chills and no shortness of breath     Past Medical History  Diagnosis Date  . Abnormal Pap smear   . HPV (human papilloma virus) anogenital infection   . Depression   . Anxiety   . PID (acute pelvic inflammatory disease)   . Pneumonia    Past Surgical History  Procedure Laterality Date  . Cosmetic surgery  2013    Breast augmentation   No family history on file. History  Substance Use Topics  . Smoking status: Current Every Day Smoker -- 0.50 packs/day    Types: Cigarettes  . Smokeless tobacco: Never Used  . Alcohol Use: Yes     Comment:  Rarely   OB History   Grav Para Term Preterm Abortions TAB SAB Ect Mult Living   2 2 2  0 0 0 0 0 0 2     Review of Systems  Constitutional: Positive for fever. Negative for chills and fatigue.  HENT: Negative for trouble swallowing.   Eyes: Negative for visual disturbance.  Respiratory: Positive for cough. Negative for shortness of breath.   Cardiovascular: Positive for chest pain. Negative for palpitations.  Gastrointestinal: Negative for nausea, vomiting, abdominal pain and diarrhea.  Genitourinary: Negative for dysuria and difficulty urinating.  Musculoskeletal: Positive for back pain. Negative for arthralgias and neck pain.  Skin: Negative for color change.  Neurological: Negative for dizziness and weakness.  Psychiatric/Behavioral: Negative for dysphoric mood.    Allergies  Review of patient's allergies indicates no known allergies.  Home Medications   Current Outpatient Rx  Name  Route  Sig  Dispense  Refill  . dicyclomine (BENTYL) 20 MG tablet   Oral   Take 1 tablet (20 mg total) by mouth every 6 (six) hours as needed (abdominal cramping).         . gabapentin (NEURONTIN) 100 MG capsule   Oral   Take 1 capsule (100 mg total) by mouth 3 (three) times daily.   90 capsule   0   . hydrOXYzine (ATARAX/VISTARIL) 25 MG tablet  Oral   Take 1 tablet (25 mg total) by mouth every 6 (six) hours as needed for anxiety.         . methocarbamol (ROBAXIN) 500 MG tablet   Oral   Take 1 tablet (500 mg total) by mouth every 8 (eight) hours as needed (muscle spasms.).         Marland Kitchen naproxen (NAPROSYN) 500 MG tablet   Oral   Take 1 tablet (500 mg total) by mouth 2 (two) times daily as needed (aching, pain, or discomfort).         . ondansetron (ZOFRAN-ODT) 4 MG disintegrating tablet   Oral   Take 1 tablet (4 mg total) by mouth every 6 (six) hours as needed for nausea.         . sertraline (ZOLOFT) 25 MG tablet   Oral   Take 1 tablet (25 mg total) by mouth daily.   30  tablet   0   . traZODone (DESYREL) 50 MG tablet   Oral   Take 1 tablet (50 mg total) by mouth at bedtime as needed and may repeat dose one time if needed for sleep.   30 tablet   0    BP 111/65  Pulse 102  Temp(Src) 98.3 F (36.8 C) (Oral)  Resp 20  Ht 5\' 4"  (1.626 m)  Wt 120 lb (54.432 kg)  BMI 20.59 kg/m2  SpO2 100%  LMP 05/05/2013 Physical Exam  Nursing note and vitals reviewed. Constitutional: She is oriented to person, place, and time. She appears well-developed and well-nourished. No distress.  HENT:  Head: Normocephalic and atraumatic.  Mouth/Throat: Oropharynx is clear and moist. No oropharyngeal exudate.  Eyes: Conjunctivae and EOM are normal. Pupils are equal, round, and reactive to light.  Neck: Normal range of motion.  Cardiovascular: Normal rate and regular rhythm.  Exam reveals no gallop and no friction rub.   No murmur heard. Pulmonary/Chest: Effort normal and breath sounds normal. She has no wheezes. She has no rales. She exhibits no tenderness.  Abdominal: Soft. She exhibits no distension. There is no tenderness. There is no rebound.  Musculoskeletal: Normal range of motion.  Neurological: She is alert and oriented to person, place, and time. Coordination normal.  Speech is goal-oriented. Moves limbs without ataxia.   Skin: Skin is warm and dry.  Psychiatric: She has a normal mood and affect. Her behavior is normal.    ED Course  Procedures (including critical care time) Labs Review Labs Reviewed - No data to display Imaging Review Dg Chest 2 View  05/05/2013   CLINICAL DATA:  28 year old female fever cough congestion. Initial encounter.  EXAM: CHEST  2 VIEW  COMPARISON:  DG CHEST 1V PORT dated 07/26/2012  FINDINGS: Normal lung volumes. Normal cardiac size and mediastinal contours. Visualized tracheal air column is within normal limits. Incidental breast attenuation artifact. The lungs are clear. No pneumothorax or effusion. No osseous abnormality  identified.  IMPRESSION: Negative, no acute cardiopulmonary abnormality.   Electronically Signed   By: Augusto Gamble M.D.   On: 05/05/2013 12:48    EKG Interpretation   None       MDM   1. URI (upper respiratory infection)     1:04 PM Vitals stable and patient afebrile. Chest xray unremarkable for acute changes. Patient likely has viral illness. Patient will be discharged with hycodan for cough. Patient instructed to rest and drink plenty of fluids. Patient advised to return with worsening or concerning symptoms.     Yvonna Alanis  Elward Nocera, PA-C 05/05/13 1309

## 2013-05-13 NOTE — ED Provider Notes (Signed)
Medical screening examination/treatment/procedure(s) were performed by non-physician practitioner and as supervising physician I was immediately available for consultation/collaboration.  EKG Interpretation   None         Corinna Burkman, MD 05/13/13 0653 

## 2013-06-10 ENCOUNTER — Encounter (HOSPITAL_BASED_OUTPATIENT_CLINIC_OR_DEPARTMENT_OTHER): Payer: Self-pay | Admitting: Emergency Medicine

## 2013-06-10 ENCOUNTER — Emergency Department (HOSPITAL_BASED_OUTPATIENT_CLINIC_OR_DEPARTMENT_OTHER)
Admission: EM | Admit: 2013-06-10 | Discharge: 2013-06-10 | Disposition: A | Discharge status: Home / Self Care | Payer: Medicaid Other | Attending: Emergency Medicine | Admitting: Emergency Medicine

## 2013-06-10 DIAGNOSIS — M5430 Sciatica, unspecified side: Secondary | ICD-10-CM | POA: Insufficient documentation

## 2013-06-10 DIAGNOSIS — Z3202 Encounter for pregnancy test, result negative: Secondary | ICD-10-CM | POA: Insufficient documentation

## 2013-06-10 DIAGNOSIS — Z8742 Personal history of other diseases of the female genital tract: Secondary | ICD-10-CM | POA: Insufficient documentation

## 2013-06-10 DIAGNOSIS — Z8659 Personal history of other mental and behavioral disorders: Secondary | ICD-10-CM | POA: Insufficient documentation

## 2013-06-10 DIAGNOSIS — Z8701 Personal history of pneumonia (recurrent): Secondary | ICD-10-CM | POA: Insufficient documentation

## 2013-06-10 DIAGNOSIS — F17200 Nicotine dependence, unspecified, uncomplicated: Secondary | ICD-10-CM | POA: Insufficient documentation

## 2013-06-10 DIAGNOSIS — Z8619 Personal history of other infectious and parasitic diseases: Secondary | ICD-10-CM | POA: Insufficient documentation

## 2013-06-10 DIAGNOSIS — M5431 Sciatica, right side: Secondary | ICD-10-CM

## 2013-06-10 LAB — URINALYSIS, ROUTINE W REFLEX MICROSCOPIC
Bilirubin Urine: NEGATIVE
GLUCOSE, UA: NEGATIVE mg/dL
HGB URINE DIPSTICK: NEGATIVE
Ketones, ur: NEGATIVE mg/dL
Leukocytes, UA: NEGATIVE
Nitrite: NEGATIVE
PH: 6 (ref 5.0–8.0)
Protein, ur: NEGATIVE mg/dL
SPECIFIC GRAVITY, URINE: 1.02 (ref 1.005–1.030)
Urobilinogen, UA: 0.2 mg/dL (ref 0.0–1.0)

## 2013-06-10 LAB — PREGNANCY, URINE: PREG TEST UR: NEGATIVE

## 2013-06-10 MED ORDER — KETOROLAC TROMETHAMINE 60 MG/2ML IM SOLN
30.0000 mg | Freq: Once | INTRAMUSCULAR | Status: AC
  Administered 2013-06-10: 30 mg via INTRAMUSCULAR
  Filled 2013-06-10: qty 2

## 2013-06-10 MED ORDER — HYDROCODONE-ACETAMINOPHEN 5-325 MG PO TABS: 2.0000 | ORAL_TABLET | ORAL | Status: DC | PRN

## 2013-06-10 MED ORDER — OXYCODONE-ACETAMINOPHEN 5-325 MG PO TABS
1.0000 | ORAL_TABLET | Freq: Once | ORAL | Status: AC
  Administered 2013-06-10: 1 via ORAL
  Filled 2013-06-10: qty 1

## 2013-06-10 MED ORDER — CYCLOBENZAPRINE HCL 10 MG PO TABS: 10.0000 mg | ORAL_TABLET | Freq: Two times a day (BID) | ORAL | Status: DC | PRN

## 2013-06-10 MED ORDER — CYCLOBENZAPRINE HCL 10 MG PO TABS
10.0000 mg | ORAL_TABLET | Freq: Once | ORAL | Status: AC
  Administered 2013-06-10: 10 mg via ORAL
  Filled 2013-06-10: qty 1

## 2013-06-10 MED ORDER — NAPROXEN 500 MG PO TABS: 500.0000 mg | ORAL_TABLET | Freq: Two times a day (BID) | ORAL | Status: DC

## 2013-06-10 NOTE — ED Notes (Signed)
Patient here with right sided lower backpain x 24 hours. Reports pain increased with movement but also complains of mild dysuria. Denies trauma

## 2013-06-10 NOTE — ED Provider Notes (Signed)
CSN: [id number]     Arrival date & time 06/10/13  1243 History   First MD Initiated Contact with Patient 06/10/13 1301     Chief Complaint  Patient presents with  . Back Pain     (Consider location/radiation/quality/duration/timing/severity/associated sxs/prior Treatment) Patient is a 28 y.o. female presenting with back pain. The history is provided by the patient.  Back Pain Location:  Lumbar spine Radiates to:  R posterior upper leg Pain severity:  Severe Pain is:  Same all the time Onset quality:  Sudden Duration:  2 days Timing:  Constant Progression:  Unchanged Chronicity:  New Relieved by:  Nothing Worsened by:  Bending and movement Ineffective treatments:  Ibuprofen Associated symptoms: no abdominal pain, no fever and no headaches    Jene EveryJaimie Mirarchi is a 28 y.o. female who presents to the ED with low back pain that started yesterday. The pain increases with movement. She is no aware of any injury to the area. No loss of control of bladder or bowels.   Past Medical History  Diagnosis Date  . Abnormal Pap smear   . HPV (human papilloma virus) anogenital infection   . Depression   . Anxiety   . PID (acute pelvic inflammatory disease)   . Pneumonia    Past Surgical History  Procedure Laterality Date  . Cosmetic surgery  2013    Breast augmentation   No family history on file. History  Substance Use Topics  . Smoking status: Current Every Day Smoker -- 0.50 packs/day    Types: Cigarettes  . Smokeless tobacco: Never Used  . Alcohol Use: Yes     Comment: Rarely   OB History   Grav Para Term Preterm Abortions TAB SAB Ect Mult Living   2 2 2  0 0 0 0 0 0 2     Review of Systems  Constitutional: Negative for fever and chills.  HENT: Negative.   Respiratory: Negative for cough and shortness of breath.   Gastrointestinal: Negative for nausea, vomiting and abdominal pain.  Genitourinary: Negative for urgency, frequency, vaginal bleeding and vaginal discharge.    Musculoskeletal: Positive for back pain.  Skin: Negative for rash.  Neurological: Negative for headaches.  Psychiatric/Behavioral: The patient is not nervous/anxious.     Allergies  Review of patient's allergies indicates no known allergies.  Home Medications  No current outpatient prescriptions on file. BP 130/73  Pulse 84  Temp(Src) 97.6 F (36.4 C)  Resp 16  SpO2 99% Physical Exam  Nursing note and vitals reviewed. Constitutional: She is oriented to person, place, and time. She appears well-developed and well-nourished.  HENT:  Head: Normocephalic and atraumatic.  Eyes: Conjunctivae and EOM are normal.  Neck: Neck supple.  Cardiovascular: Normal rate and regular rhythm.   Pulmonary/Chest: Effort normal. She has no wheezes. She has no rales.  Abdominal: Soft. Bowel sounds are normal. There is no tenderness.  Musculoskeletal: Normal range of motion.       Lumbar back: She exhibits tenderness and spasm. She exhibits normal range of motion, no deformity and normal pulse.       Back:  Neurological: She is alert and oriented to person, place, and time. She has normal strength. No cranial nerve deficit or sensory deficit. Gait normal.  Reflex Scores:      Bicep reflexes are 2+ on the right side and 2+ on the left side.      Brachioradialis reflexes are 2+ on the right side and 2+ on the left side.  Patellar reflexes are 2+ on the right side and 2+ on the left side.      Achilles reflexes are 2+ on the right side and 2+ on the left side. Pedal pulses equal, adequate circulation, good touch sensation.  Skin: Skin is warm and dry.  Psychiatric: She has a normal mood and affect. Her behavior is normal.    Results for orders placed during the hospital encounter of 06/10/13 (from the past 24 hour(s))  URINALYSIS, ROUTINE W REFLEX MICROSCOPIC     Status: None   Collection Time    06/10/13 12:56 PM      Result Value Ref Range   Color, Urine YELLOW  YELLOW   APPearance CLEAR   CLEAR   Specific Gravity, Urine 1.020  1.005 - 1.030   pH 6.0  5.0 - 8.0   Glucose, UA NEGATIVE  NEGATIVE mg/dL   Hgb urine dipstick NEGATIVE  NEGATIVE   Bilirubin Urine NEGATIVE  NEGATIVE   Ketones, ur NEGATIVE  NEGATIVE mg/dL   Protein, ur NEGATIVE  NEGATIVE mg/dL   Urobilinogen, UA 0.2  0.0 - 1.0 mg/dL   Nitrite NEGATIVE  NEGATIVE   Leukocytes, UA NEGATIVE  NEGATIVE  PREGNANCY, URINE     Status: None   Collection Time    06/10/13 12:56 PM      Result Value Ref Range   Preg Test, Ur NEGATIVE  NEGATIVE    ED Course  Procedures  MDM  28 y.o. female with low back pain that radiates to the right buttock x 24 hours. I have reviewed this patient's vital signs, nurses notes, appropriate labs and discussed findings and plan of care with the patient. She voices understanding. Will treat for pain and muscle spasm.    Medication List         cyclobenzaprine 10 MG tablet  Commonly known as:  FLEXERIL  Take 1 tablet (10 mg total) by mouth 2 (two) times daily as needed for muscle spasms.     HYDROcodone-acetaminophen 5-325 MG per tablet  Commonly known as:  NORCO/VICODIN  Take 2 tablets by mouth every 4 (four) hours as needed.     naproxen 500 MG tablet  Commonly known as:  NAPROSYN  Take 1 tablet (500 mg total) by mouth 2 (two) times daily.         [facility] Orlene Och, [place] 06/11/13 [phone]

## 2013-06-10 NOTE — Discharge Instructions (Signed)
Take the medication as directed. Do not take the muscle relaxant or the narcotic if you are driving because they will cause you to be sleepy. Return as needed for worsening symptoms.

## 2013-06-11 NOTE — ED Provider Notes (Signed)
Medical screening examination/treatment/procedure(s) were performed by non-physician practitioner and as supervising physician I was immediately available for consultation/collaboration.  EKG Interpretation   None        Ethelda ChickMartha K Linker, MD 06/11/13 [phone]

## 2013-06-27 ENCOUNTER — Emergency Department (HOSPITAL_BASED_OUTPATIENT_CLINIC_OR_DEPARTMENT_OTHER): Payer: Medicaid Other

## 2013-06-27 ENCOUNTER — Emergency Department (HOSPITAL_BASED_OUTPATIENT_CLINIC_OR_DEPARTMENT_OTHER)
Admission: EM | Admit: 2013-06-27 | Discharge: 2013-06-27 | Disposition: A | Discharge status: Home / Self Care | Payer: Medicaid Other | Attending: Emergency Medicine | Admitting: Emergency Medicine

## 2013-06-27 ENCOUNTER — Encounter (HOSPITAL_BASED_OUTPATIENT_CLINIC_OR_DEPARTMENT_OTHER): Payer: Self-pay | Admitting: Emergency Medicine

## 2013-06-27 DIAGNOSIS — M4120 Other idiopathic scoliosis, site unspecified: Secondary | ICD-10-CM | POA: Insufficient documentation

## 2013-06-27 DIAGNOSIS — Z8701 Personal history of pneumonia (recurrent): Secondary | ICD-10-CM | POA: Insufficient documentation

## 2013-06-27 DIAGNOSIS — S39012A Strain of muscle, fascia and tendon of lower back, initial encounter: Secondary | ICD-10-CM

## 2013-06-27 DIAGNOSIS — Z8742 Personal history of other diseases of the female genital tract: Secondary | ICD-10-CM | POA: Insufficient documentation

## 2013-06-27 DIAGNOSIS — S46909A Unspecified injury of unspecified muscle, fascia and tendon at shoulder and upper arm level, unspecified arm, initial encounter: Secondary | ICD-10-CM | POA: Insufficient documentation

## 2013-06-27 DIAGNOSIS — S335XXA Sprain of ligaments of lumbar spine, initial encounter: Secondary | ICD-10-CM | POA: Insufficient documentation

## 2013-06-27 DIAGNOSIS — Z8659 Personal history of other mental and behavioral disorders: Secondary | ICD-10-CM | POA: Insufficient documentation

## 2013-06-27 DIAGNOSIS — Z8719 Personal history of other diseases of the digestive system: Secondary | ICD-10-CM | POA: Insufficient documentation

## 2013-06-27 DIAGNOSIS — Z8619 Personal history of other infectious and parasitic diseases: Secondary | ICD-10-CM | POA: Insufficient documentation

## 2013-06-27 DIAGNOSIS — F17200 Nicotine dependence, unspecified, uncomplicated: Secondary | ICD-10-CM | POA: Insufficient documentation

## 2013-06-27 DIAGNOSIS — S4980XA Other specified injuries of shoulder and upper arm, unspecified arm, initial encounter: Secondary | ICD-10-CM | POA: Insufficient documentation

## 2013-06-27 DIAGNOSIS — M419 Scoliosis, unspecified: Secondary | ICD-10-CM

## 2013-06-27 MED ORDER — OXYCODONE-ACETAMINOPHEN 5-325 MG PO TABS: 1.0000 | ORAL_TABLET | Freq: Four times a day (QID) | ORAL | Status: DC | PRN

## 2013-06-27 MED ORDER — OXYCODONE-ACETAMINOPHEN 5-325 MG PO TABS
1.0000 | ORAL_TABLET | Freq: Once | ORAL | Status: AC
  Administered 2013-06-27: 1 via ORAL
  Filled 2013-06-27: qty 1

## 2013-06-27 MED ORDER — CYCLOBENZAPRINE HCL 10 MG PO TABS: 10.0000 mg | ORAL_TABLET | Freq: Two times a day (BID) | ORAL | Status: DC | PRN
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
## 2013-06-27 NOTE — ED Notes (Signed)
Husband at bedside.  

## 2013-06-27 NOTE — ED Notes (Signed)
MD at bedside. 
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
## 2013-06-27 NOTE — ED Notes (Signed)
Patient transported to X-ray via stretcher per tech. 

## 2014-02-26 ENCOUNTER — Encounter (HOSPITAL_BASED_OUTPATIENT_CLINIC_OR_DEPARTMENT_OTHER): Payer: Self-pay | Admitting: Emergency Medicine

## 2014-04-12 IMAGING — CR DG LUMBAR SPINE COMPLETE 4+V
5 series · 5 of 5 positions shown · non-contrast
Comparison: 12/29/2012 *INSERT* [HOSPITAL] lumbar radiographs.

CLINICAL DATA: 27-year-old female status post blunt trauma with
pain. Initial encounter.

EXAM:
LUMBAR SPINE - COMPLETE 4+ VIEW

[t l-spine a.p.]
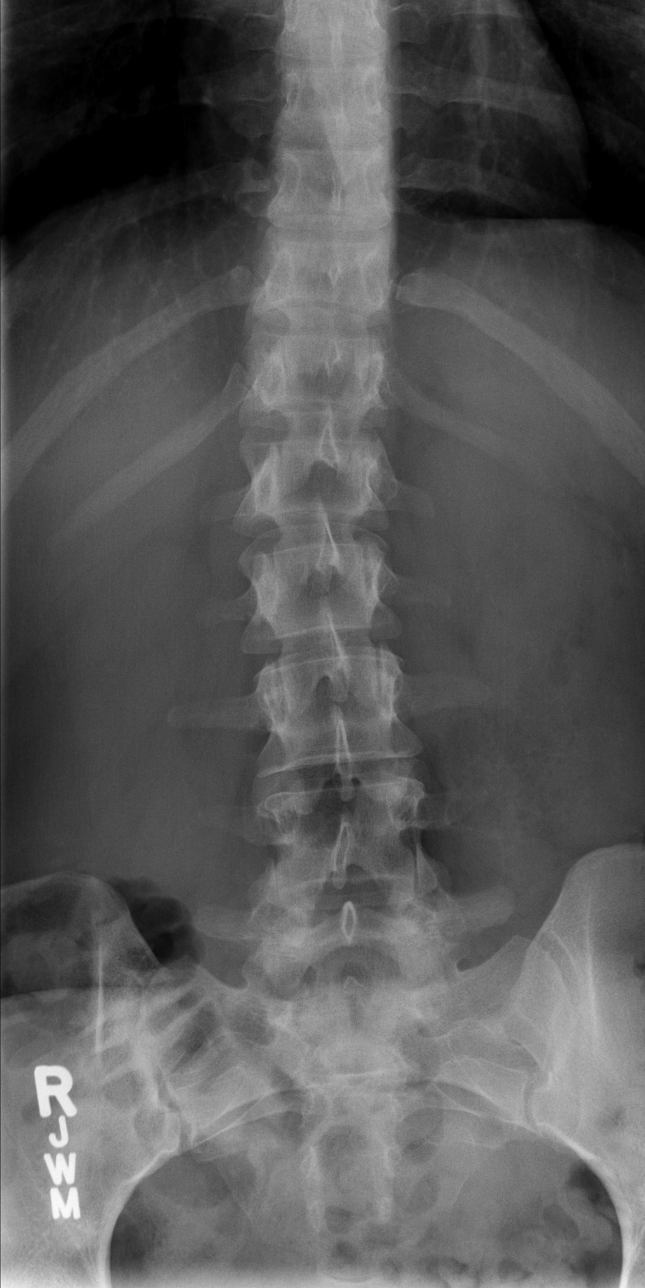

[t l-spine oblique exposure (1 of 2)]
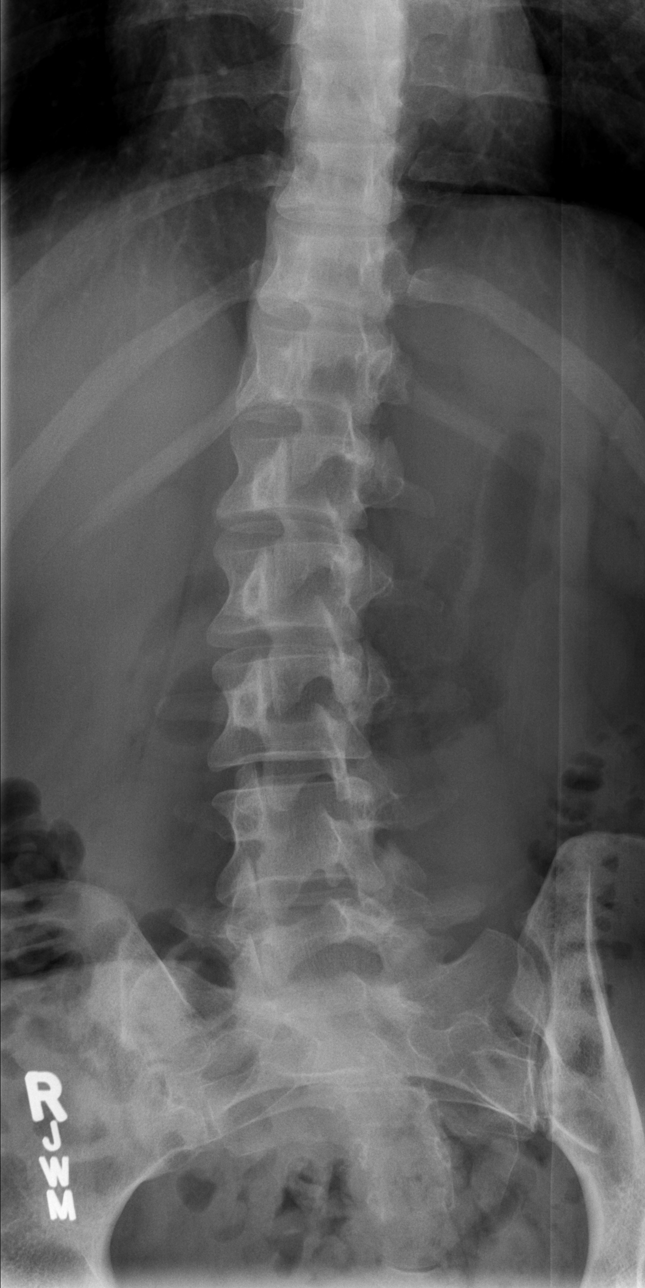

[t l-spine oblique exposure (2 of 2)]
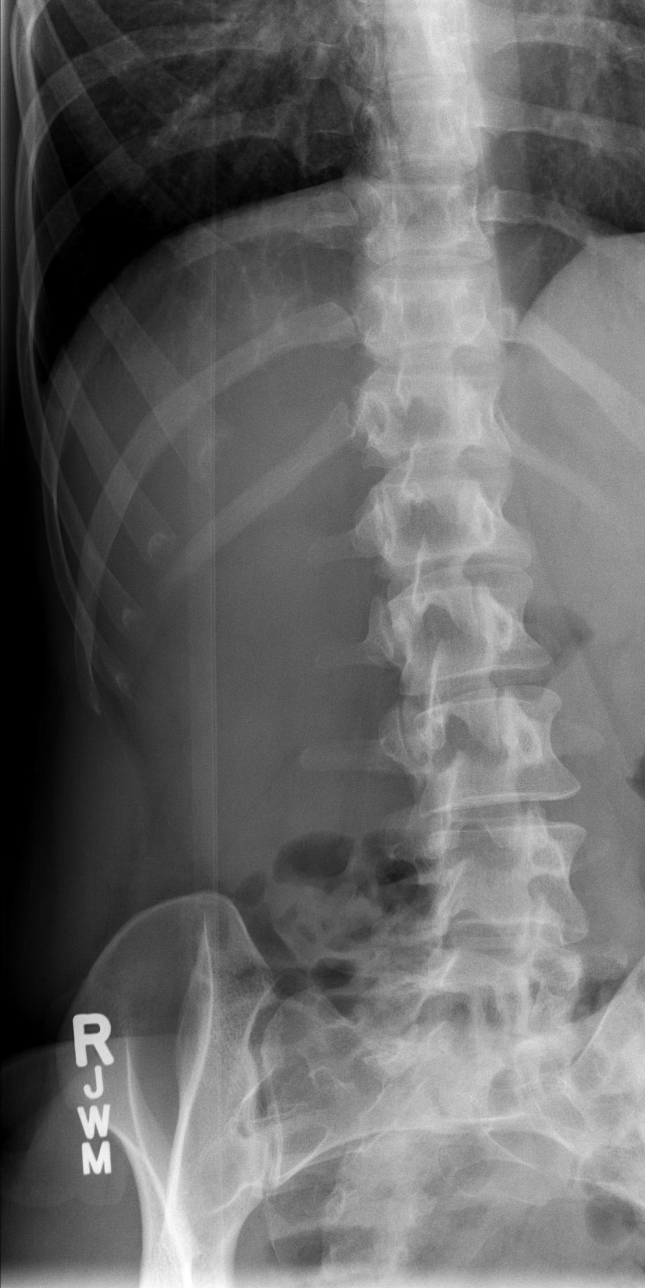

[t l-spine lat]
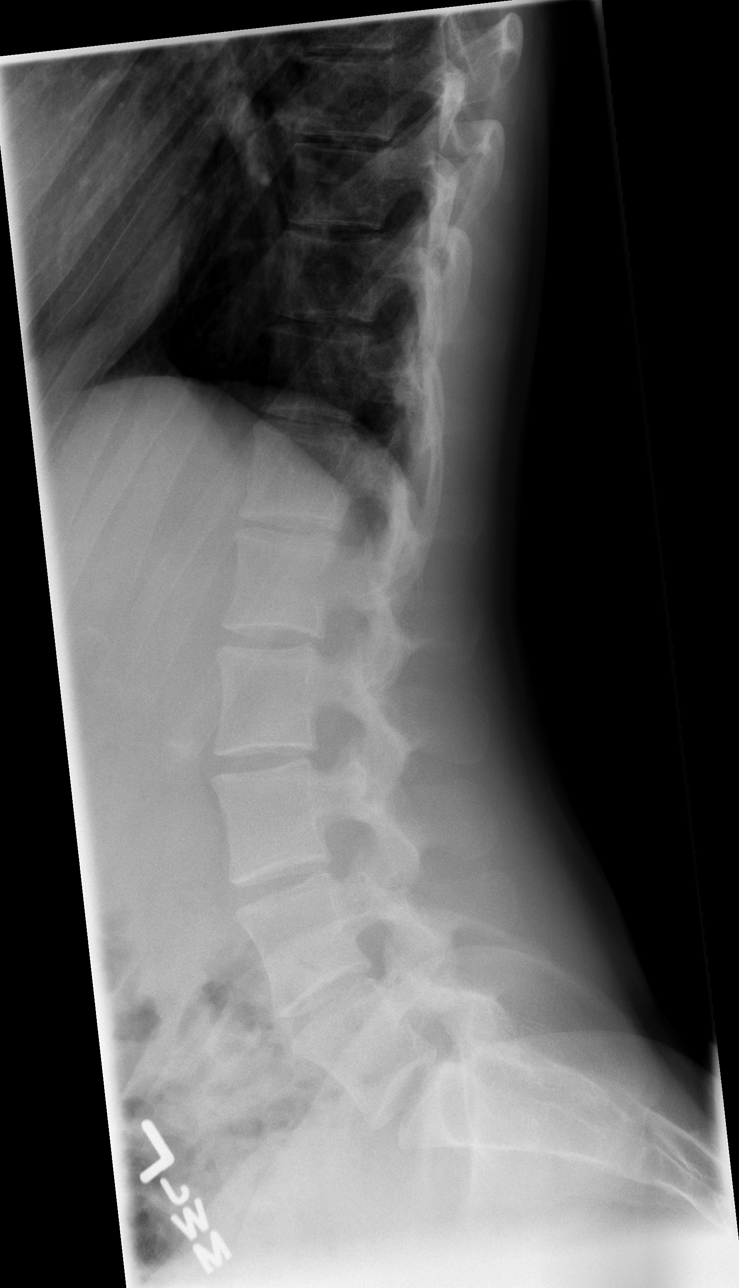

[t l-spine l5-s1 spot]
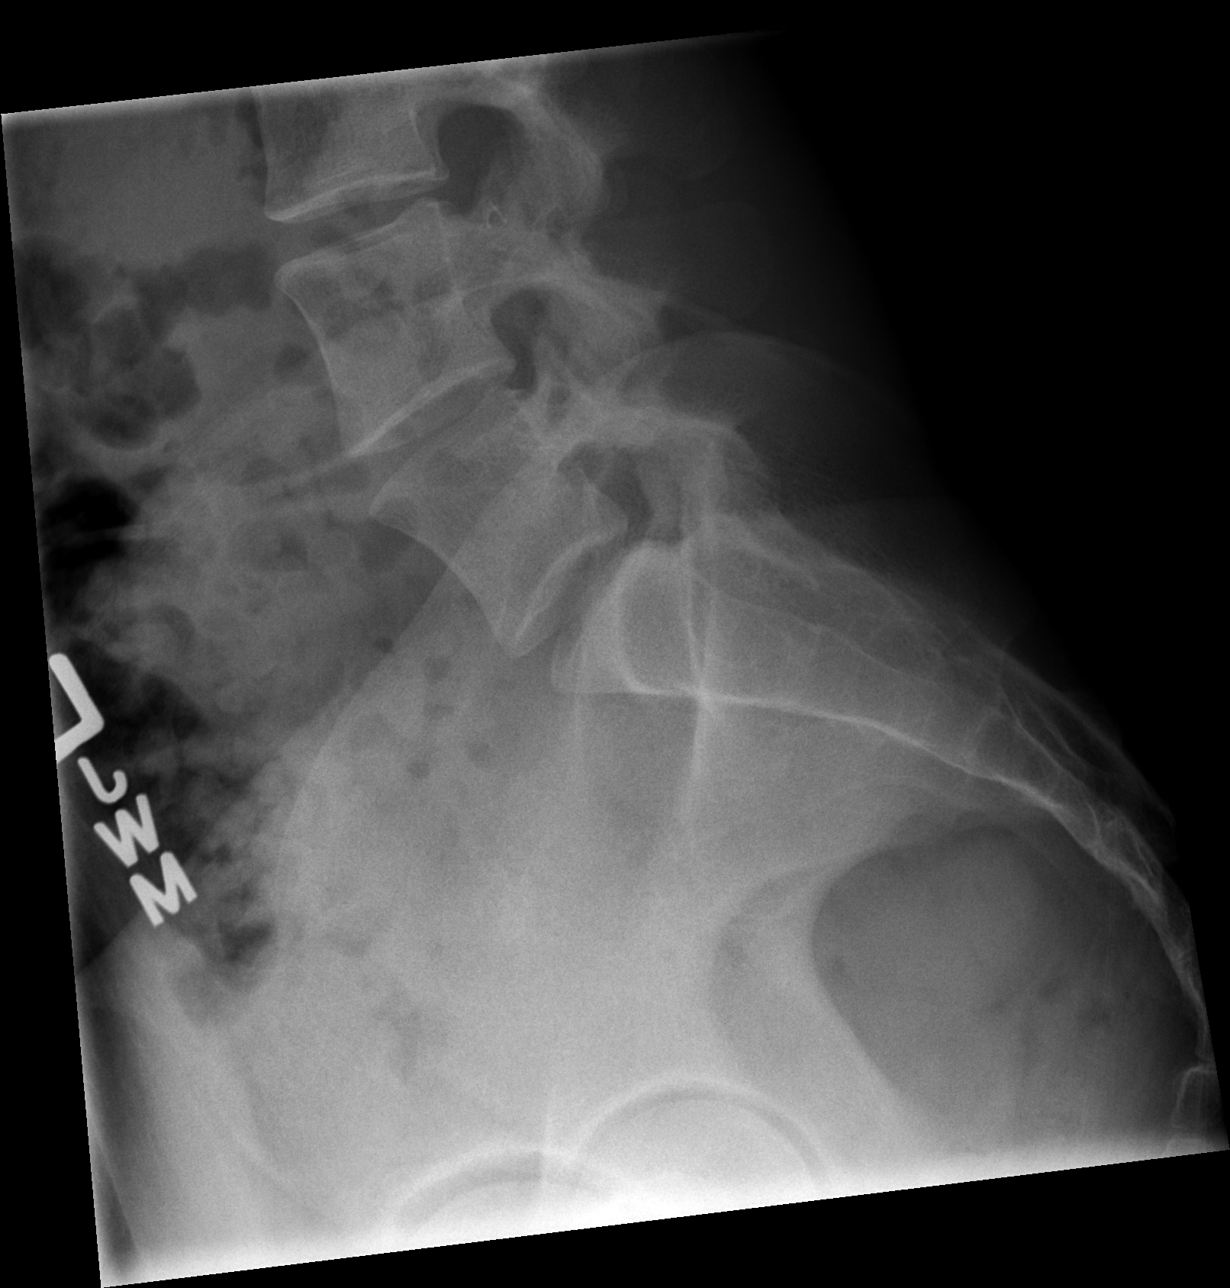

[5 of 5 positions shown; findings below may reference images not displayed]

FINDINGS: Normal lumbar segmentation. Bone mineralization is within normal
limits. Stable vertebral height and alignment, with minor scoliosis.
Sacral ala and SI joints appear stable and within normal limits. No
pars fracture. Visible lower thoracic levels appear intact. Negative
visible lung bases.
IMPRESSION: No acute fracture or dislocation identified about the lumbar spine.

## 2016-04-02 ENCOUNTER — Other Ambulatory Visit: Payer: Self-pay | Admitting: Obstetrics

## 2016-04-03 LAB — CYTOLOGY - PAP

## 2016-04-16 LAB — OB RESULTS CONSOLE RPR: RPR: NONREACTIVE

## 2016-04-16 LAB — OB RESULTS CONSOLE GC/CHLAMYDIA
CHLAMYDIA, DNA PROBE: NEGATIVE
GC PROBE AMP, GENITAL: NEGATIVE

## 2016-04-16 LAB — OB RESULTS CONSOLE ANTIBODY SCREEN: ANTIBODY SCREEN: NEGATIVE

## 2016-04-16 LAB — OB RESULTS CONSOLE ABO/RH: RH Type: POSITIVE

## 2016-04-16 LAB — OB RESULTS CONSOLE HEPATITIS B SURFACE ANTIGEN: HEP B S AG: NEGATIVE

## 2016-04-16 LAB — OB RESULTS CONSOLE RUBELLA ANTIBODY, IGM: RUBELLA: IMMUNE

## 2016-04-16 LAB — OB RESULTS CONSOLE HIV ANTIBODY (ROUTINE TESTING): HIV: NONREACTIVE

## 2016-04-27 NOTE — L&D Delivery Note (Signed)
Delivery Note Patient pushed well for 10 minutes.  At 7:45 PM a viable female was delivered via Vaginal, Spontaneous Delivery (Presentation: ;LOA).  APGAR: 8, 9; weight pending.   Placenta status: spontaneous, in tact.  Cord:3V  with the following complications: None .  Cord pH: n/a  Anesthesia:  Epidural Episiotomy: None Lacerations: 1st degree;Perineal Suture Repair: 3.0 vicryl rapide Est. Blood Loss (mL): 200  Mom to postpartum.  Baby to Couplet care / Skin to Skin.  Medford Staheli GEFFEL Cathleen Yagi 11/23/2016, 8:05 PM

## 2016-10-30 LAB — OB RESULTS CONSOLE GBS: GBS: NEGATIVE

## 2016-11-19 ENCOUNTER — Encounter (HOSPITAL_COMMUNITY): Payer: Self-pay | Admitting: *Deleted

## 2016-11-19 ENCOUNTER — Telehealth (HOSPITAL_COMMUNITY): Payer: Self-pay | Admitting: *Deleted

## 2016-11-19 NOTE — Telephone Encounter (Signed)
Preadmission screen  

## 2016-11-23 ENCOUNTER — Inpatient Hospital Stay (HOSPITAL_COMMUNITY)
Admission: RE | Admit: 2016-11-23 | Discharge: 2016-11-25 | DRG: 775 | Disposition: A | Discharge status: Home / Self Care | Payer: Medicaid Other | Source: Ambulatory Visit | Attending: Obstetrics | Admitting: Obstetrics

## 2016-11-23 ENCOUNTER — Encounter (HOSPITAL_COMMUNITY): Payer: Self-pay

## 2016-11-23 ENCOUNTER — Inpatient Hospital Stay (HOSPITAL_COMMUNITY): Payer: Medicaid Other | Admitting: Anesthesiology

## 2016-11-23 DIAGNOSIS — O26893 Other specified pregnancy related conditions, third trimester: Secondary | ICD-10-CM | POA: Diagnosis present

## 2016-11-23 DIAGNOSIS — Z3483 Encounter for supervision of other normal pregnancy, third trimester: Secondary | ICD-10-CM

## 2016-11-23 DIAGNOSIS — Z3A39 39 weeks gestation of pregnancy: Secondary | ICD-10-CM | POA: Diagnosis not present

## 2016-11-23 DIAGNOSIS — Z87891 Personal history of nicotine dependence: Secondary | ICD-10-CM

## 2016-11-23 LAB — RAPID URINE DRUG SCREEN, HOSP PERFORMED
AMPHETAMINES: NOT DETECTED
Barbiturates: NOT DETECTED
Benzodiazepines: NOT DETECTED
Cocaine: NOT DETECTED
Opiates: NOT DETECTED
Tetrahydrocannabinol: NOT DETECTED

## 2016-11-23 LAB — CBC
HCT: 30.4 % — ABNORMAL LOW (ref 36.0–46.0)
Hemoglobin: 9.8 g/dL — ABNORMAL LOW (ref 12.0–15.0)
MCH: 28.7 pg (ref 26.0–34.0)
MCHC: 32.2 g/dL (ref 30.0–36.0)
MCV: 89.1 fL (ref 78.0–100.0)
PLATELETS: 222 10*3/uL (ref 150–400)
RBC: 3.41 MIL/uL — ABNORMAL LOW (ref 3.87–5.11)
RDW: 13.4 % (ref 11.5–15.5)
WBC: 9.6 10*3/uL (ref 4.0–10.5)

## 2016-11-23 LAB — TYPE AND SCREEN
ABO/RH(D): O POS
ANTIBODY SCREEN: NEGATIVE

## 2016-11-23 LAB — RPR: RPR Ser Ql: NONREACTIVE

## 2016-11-23 MED ORDER — WITCH HAZEL-GLYCERIN EX PADS: 1.0000 "application " | MEDICATED_PAD | CUTANEOUS | Status: DC | PRN

## 2016-11-23 MED ORDER — SIMETHICONE 80 MG PO CHEW: 80.0000 mg | CHEWABLE_TABLET | ORAL | Status: DC | PRN

## 2016-11-23 MED ORDER — SENNOSIDES-DOCUSATE SODIUM 8.6-50 MG PO TABS
2.0000 | ORAL_TABLET | ORAL | Status: DC
  Administered 2016-11-23 – 2016-11-24 (×2): 2 via ORAL
  Filled 2016-11-23 (×2): qty 2

## 2016-11-23 MED ORDER — OXYTOCIN 40 UNITS IN LACTATED RINGERS INFUSION - SIMPLE MED
1.0000 m[IU]/min | INTRAVENOUS | Status: DC
  Administered 2016-11-23: 6 m[IU]/min via INTRAVENOUS
  Administered 2016-11-23: 2 m[IU]/min via INTRAVENOUS

## 2016-11-23 MED ORDER — LIDOCAINE HCL (PF) 1 % IJ SOLN
30.0000 mL | INTRAMUSCULAR | Status: DC | PRN
  Filled 2016-11-23: qty 30

## 2016-11-23 MED ORDER — ONDANSETRON HCL 4 MG/2ML IJ SOLN: 4.0000 mg | INTRAMUSCULAR | Status: DC | PRN

## 2016-11-23 MED ORDER — OXYTOCIN 40 UNITS IN LACTATED RINGERS INFUSION - SIMPLE MED
2.5000 [IU]/h | INTRAVENOUS | Status: DC
  Filled 2016-11-23: qty 1000

## 2016-11-23 MED ORDER — PHENYLEPHRINE 40 MCG/ML (10ML) SYRINGE FOR IV PUSH (FOR BLOOD PRESSURE SUPPORT)
80.0000 ug | PREFILLED_SYRINGE | INTRAVENOUS | Status: DC | PRN
  Filled 2016-11-23: qty 10

## 2016-11-23 MED ORDER — PHENYLEPHRINE 40 MCG/ML (10ML) SYRINGE FOR IV PUSH (FOR BLOOD PRESSURE SUPPORT): 80.0000 ug | PREFILLED_SYRINGE | INTRAVENOUS | Status: DC | PRN

## 2016-11-23 MED ORDER — TETANUS-DIPHTH-ACELL PERTUSSIS 5-2.5-18.5 LF-MCG/0.5 IM SUSP
0.5000 mL | Freq: Once | INTRAMUSCULAR | Status: DC
  Filled 2016-11-23: qty 0.5

## 2016-11-23 MED ORDER — ACETAMINOPHEN 325 MG PO TABS
650.0000 mg | ORAL_TABLET | ORAL | Status: DC | PRN
Start: 2016-11-23 — End: 2016-11-25
  Administered 2016-11-23 – 2016-11-25 (×7): 650 mg via ORAL
  Filled 2016-11-23 (×8): qty 2

## 2016-11-23 MED ORDER — IBUPROFEN 600 MG PO TABS
600.0000 mg | ORAL_TABLET | Freq: Four times a day (QID) | ORAL | Status: DC
  Administered 2016-11-23 – 2016-11-25 (×7): 600 mg via ORAL
  Filled 2016-11-23 (×8): qty 1

## 2016-11-23 MED ORDER — PRENATAL MULTIVITAMIN CH
1.0000 | ORAL_TABLET | Freq: Every day | ORAL | Status: DC
  Administered 2016-11-24 – 2016-11-25 (×2): 1 via ORAL
  Filled 2016-11-23 (×2): qty 1

## 2016-11-23 MED ORDER — BENZOCAINE-MENTHOL 20-0.5 % EX AERO: 1.0000 "application " | INHALATION_SPRAY | CUTANEOUS | Status: DC | PRN

## 2016-11-23 MED ORDER — LACTATED RINGERS IV SOLN
INTRAVENOUS | Status: DC
  Administered 2016-11-23: 14:00:00 via INTRAVENOUS

## 2016-11-23 MED ORDER — FENTANYL 2.5 MCG/ML BUPIVACAINE 1/10 % EPIDURAL INFUSION (WH - ANES)
14.0000 mL/h | INTRAMUSCULAR | Status: DC | PRN
  Administered 2016-11-23: 14 mL/h via EPIDURAL
  Filled 2016-11-23: qty 100

## 2016-11-23 MED ORDER — COCONUT OIL OIL: 1.0000 "application " | TOPICAL_OIL | Status: DC | PRN

## 2016-11-23 MED ORDER — DIPHENHYDRAMINE HCL 25 MG PO CAPS: 25.0000 mg | ORAL_CAPSULE | Freq: Four times a day (QID) | ORAL | Status: DC | PRN

## 2016-11-23 MED ORDER — EPHEDRINE 5 MG/ML INJ: 10.0000 mg | INTRAVENOUS | Status: DC | PRN

## 2016-11-23 MED ORDER — ONDANSETRON HCL 4 MG PO TABS: 4.0000 mg | ORAL_TABLET | ORAL | Status: DC | PRN

## 2016-11-23 MED ORDER — OXYTOCIN BOLUS FROM INFUSION
500.0000 mL | Freq: Once | INTRAVENOUS | Status: AC
  Administered 2016-11-23: 500 mL via INTRAVENOUS

## 2016-11-23 MED ORDER — LACTATED RINGERS IV SOLN: 500.0000 mL | INTRAVENOUS | Status: DC | PRN

## 2016-11-23 MED ORDER — DIBUCAINE 1 % RE OINT: 1.0000 "application " | TOPICAL_OINTMENT | RECTAL | Status: DC | PRN

## 2016-11-23 MED ORDER — LIDOCAINE HCL (PF) 1 % IJ SOLN
INTRAMUSCULAR | Status: DC | PRN
  Administered 2016-11-23 (×2): 7 mL via EPIDURAL

## 2016-11-23 MED ORDER — ACETAMINOPHEN 325 MG PO TABS: 650.0000 mg | ORAL_TABLET | ORAL | Status: DC | PRN

## 2016-11-23 MED ORDER — DIPHENHYDRAMINE HCL 50 MG/ML IJ SOLN: 12.5000 mg | INTRAMUSCULAR | Status: DC | PRN

## 2016-11-23 MED ORDER — LACTATED RINGERS IV SOLN
500.0000 mL | Freq: Once | INTRAVENOUS | Status: AC
  Administered 2016-11-23: 500 mL via INTRAVENOUS

## 2016-11-23 MED ORDER — TERBUTALINE SULFATE 1 MG/ML IJ SOLN: 0.2500 mg | Freq: Once | INTRAMUSCULAR | Status: DC | PRN

## 2016-11-23 MED ORDER — SOD CITRATE-CITRIC ACID 500-334 MG/5ML PO SOLN: 30.0000 mL | ORAL | Status: DC | PRN

## 2016-11-23 MED ORDER — ONDANSETRON HCL 4 MG/2ML IJ SOLN
4.0000 mg | Freq: Four times a day (QID) | INTRAMUSCULAR | Status: DC | PRN
  Administered 2016-11-23: 4 mg via INTRAVENOUS
  Filled 2016-11-23: qty 2

## 2016-11-23 NOTE — Anesthesia Procedure Notes (Signed)
Epidural Patient location during procedure: OB Start time: 11/23/2016 2:52 PM End time: 11/23/2016 2:56 PM  Staffing Anesthesiologist: Leilani AbleHATCHETT, Rehman Levinson Performed: anesthesiologist   Preanesthetic Checklist Completed: patient identified, surgical consent, pre-op evaluation, timeout performed, IV checked, risks and benefits discussed and monitors and equipment checked  Epidural Patient position: sitting Prep: site prepped and draped and DuraPrep Patient monitoring: continuous pulse ox and blood pressure Approach: midline Location: L3-L4 Injection technique: LOR air  Needle:  Needle type: Tuohy  Needle gauge: 17 G Needle length: 9 cm and 9 Needle insertion depth: 5 cm cm Catheter type: closed end flexible Catheter size: 19 Gauge Catheter at skin depth: 10 cm Test dose: negative and Other  Assessment Sensory level: T9 Events: blood not aspirated, injection not painful, no injection resistance, negative IV test and no paresthesia  Additional Notes Reason for block:procedure for pain

## 2016-11-23 NOTE — Anesthesia Pain Management Evaluation Note (Signed)
  CRNA Pain Management Visit Note  Patient: Janice Saunders, 31 y.o., female  "Hello I am a member of the anesthesia team at [facility]. We have an anesthesia team available at all times to provide care throughout the hospital, including epidural management and anesthesia for C-section. I don't know your plan for the delivery whether it a natural birth, water birth, IV sedation, nitrous supplementation, doula or epidural, but we want to meet your pain goals."   1.Was your pain managed to your expectations on prior hospitalizations?   Yes   2.What is your expectation for pain management during this hospitalization?     Epidural  3.How can we help you reach that goal?   Record the patient's initial score and the patient's pain goal.   Pain: 2  Pain Goal: 7 [facility] wants you to be able to say your pain was always managed very well.  Elayna Tobler Hristova 11/23/2016

## 2016-11-23 NOTE — Progress Notes (Signed)
Inc discomfort w ctx  BP 117/76   Pulse 96   Temp 98.6 F (37 C) (Oral)   Wt 76.7 kg (169 lb)   LMP 02/01/2016   BMI 29.01 kg/m  Toco: q2-4 min EFM: 130s, mod var, + accels, no decels SVE: 4/30/-2, AROM clear fluid  g3p2 @ 3233w1d w EIOL Cont pitocin Epidural upon request FSR/vtx/gbs neg

## 2016-11-23 NOTE — Anesthesia Preprocedure Evaluation (Signed)
Anesthesia Evaluation  Patient identified by MRN, date of birth, ID band Patient awake    Reviewed: Allergy & Precautions, H&P , Patient's Chart, lab work & pertinent test results  Airway Mallampati: I  TM Distance: >3 FB Neck ROM: full    Dental no notable dental hx. (+) Teeth Intact   Pulmonary former smoker,    Pulmonary exam normal breath sounds clear to auscultation       Cardiovascular negative cardio ROS Normal cardiovascular exam Rhythm:regular Rate:Normal     Neuro/Psych negative neurological ROS     GI/Hepatic negative GI ROS, Neg liver ROS,   Endo/Other  negative endocrine ROS  Renal/GU negative Renal ROS     Musculoskeletal negative musculoskeletal ROS (+)   Abdominal (+) + obese,   Peds  Hematology negative hematology ROS (+)   Anesthesia Other Findings   Reproductive/Obstetrics (+) Pregnancy                             Anesthesia Physical Anesthesia Plan  ASA: II  Anesthesia Plan: Epidural   Post-op Pain Management:    Induction:   PONV Risk Score and Plan:   Airway Management Planned:   Additional Equipment:   Intra-op Plan:   Post-operative Plan:   Informed Consent: I have reviewed the patients History and Physical, chart, labs and discussed the procedure including the risks, benefits and alternatives for the proposed anesthesia with the patient or authorized representative who has indicated his/her understanding and acceptance.     Plan Discussed with:   Anesthesia Plan Comments:         Anesthesia Quick Evaluation

## 2016-11-23 NOTE — H&P (Signed)
31 y.o. [id number] @ 2970w1d presents for elective IOL.  Otherwise has good fetal movement and no bleeding.  1. History of opiate abuse: last used April 2016.  NA for continued abstinence   Past Medical History:  Diagnosis Date  . Abnormal Pap smear   . Anxiety   . Depression   . HPV (human papilloma virus) anogenital infection   . PID (acute pelvic inflammatory disease)   . Pneumonia     Past Surgical History:  Procedure Laterality Date  . BREAST SURGERY     aug  . COSMETIC SURGERY  2013   Breast augmentation  . INCISE AND DRAIN ABCESS     buttocks    OB History  Gravida Para Term Preterm AB Living  3 2 2  0 0 2  SAB TAB Ectopic Multiple Live Births  0 0 0 0 2    # Outcome Date GA Lbr Len/2nd Weight Sex Delivery Anes PTL Lv  3 Current           2 Term 05/29/10 5252w0d  3.884 kg (8 lb 9 oz) M Vag-Spont   LIV  1 Term 07/27/08 5543w0d  3.629 kg (8 lb) M Vag-Spont   LIV      Social History   Social History  . Marital status: Legally Separated    Spouse name: N/A  . Number of children: N/A  . Years of education: N/A   Occupational History  . Not on file.   Social History Main Topics  . Smoking status: Former Smoker    Packs/day: 0.50    Types: Cigarettes  . Smokeless tobacco: Never Used  . Alcohol use Yes     Comment: Rarely  . 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 Sexual activity: Yes    Birth control/ protection: Injection   Other Topics Concern  . Not on file   Social History Narrative  . No narrative on file   Patient has no allergy information on record.    Prenatal Transfer Tool  Maternal Diabetes: No Genetic Screening: Declined Maternal Ultrasounds/Referrals: Normal Fetal Ultrasounds or other Referrals:  None Maternal Substance Abuse:  No , h/o opiate abuse--clean x 2.5 years Significant Maternal Medications:  None Significant Maternal Lab Results: Lab values include: Group B Strep negative  ABO, Rh: O/Positive/-- (12/21  0000) Antibody: Negative (12/21 0000) Rubella: Immune (12/21 0000) RPR: Nonreactive (12/21 0000)  HBsAg: Negative (12/21 0000)  HIV: Non-reactive (12/21 0000)  GBS: Negative (07/06 0000)     Other PNC: uncomplicated.  108/75  P 102  General:  NAD Abdomen:  soft, gravid, EFW 8# Ex:  no edema SVE:  3/30/-2/posterior/soft FHTs:  140s, mod var, + accels, no decels Toco:  irregular   A/P   31 y.o. [id number] 3870w1d presents for EIOL Admit to L&D IOL with pitocin Epidural upon request H/o opiate abuse--clean since 2016.  No PO / IV narcotic pain meds per pt request.  UDS on admission  FSR/ vtx/ GBS neg  Janice Saunders Janice Saunders

## 2016-11-24 LAB — CBC
HEMATOCRIT: 27.9 % — AB (ref 36.0–46.0)
HEMOGLOBIN: 9.2 g/dL — AB (ref 12.0–15.0)
MCH: 29.4 pg (ref 26.0–34.0)
MCHC: 33 g/dL (ref 30.0–36.0)
MCV: 89.1 fL (ref 78.0–100.0)
Platelets: 192 10*3/uL (ref 150–400)
RBC: 3.13 MIL/uL — ABNORMAL LOW (ref 3.87–5.11)
RDW: 13.5 % (ref 11.5–15.5)
WBC: 12 10*3/uL — ABNORMAL HIGH (ref 4.0–10.5)

## 2016-11-24 MED ORDER — OXYCODONE HCL 5 MG PO TABS
5.0000 mg | ORAL_TABLET | ORAL | Status: DC | PRN
  Administered 2016-11-25: 5 mg via ORAL
  Filled 2016-11-24 (×2): qty 1

## 2016-11-24 MED ORDER — MORPHINE SULFATE (PF) 4 MG/ML IV SOLN
2.0000 mg | Freq: Once | INTRAVENOUS | Status: AC
  Administered 2016-11-24: 2 mg via INTRAVENOUS
  Filled 2016-11-24: qty 1

## 2016-11-24 NOTE — Plan of Care (Signed)
Problem: Pain Management: Goal: General experience of comfort will improve and pain level will decrease Outcome: Progressing Pt c/o back pain without much relief via medication. RN offered repositioning, back rub, and heat pack. RN encouraged patient to use recliner, instead of bed and ambulate in room which did provide some relief. Will continue to monitor pain.

## 2016-11-24 NOTE — Plan of Care (Signed)
Problem: Nutritional: Goal: Mothers verbalization of comfort with breastfeeding process will improve Outcome: Not Applicable Date Met: 45/99/77 MOB not breastfeeding

## 2016-11-24 NOTE — Progress Notes (Signed)
Post Partum Day 1 Subjective: Patient reports severe cramping, up ad lib, voiding and tolerating PO  Objective: Blood pressure (!) 104/54, pulse 90, temperature 98.2 F (36.8 C), temperature source Oral, resp. rate 16, height 5\' 4"  (1.626 m), weight 76.7 kg (169 lb), last menstrual period 02/01/2016, SpO2 99 %, unknown if currently breastfeeding.  Physical Exam:  General: alert, cooperative and no distress Lochia: appropriate Uterine Fundus: firm Incision: healing well, no significant drainage DVT Evaluation: No evidence of DVT seen on physical exam. Negative Homan's sign. No cords or calf tenderness.   Recent Labs  11/23/16 0725 11/24/16 0516  HGB 9.8* 9.2*  HCT 30.4* 27.9*    Assessment/Plan: Plan for discharge tomorrow and Breastfeeding  Will add Oxycodone to her medication regimen   LOS: 1 day   Benjimin Hadden STACIA 11/24/2016, 9:20 AM

## 2016-11-24 NOTE — Anesthesia Postprocedure Evaluation (Signed)
Anesthesia Post Note  Patient: Janice Saunders  Procedure(s) Performed: * No procedures listed *     Patient location during evaluation: Mother Baby Anesthesia Type: Epidural Level of consciousness: awake and alert and oriented Pain management: pain level controlled Vital Signs Assessment: post-procedure vital signs reviewed and stable Respiratory status: spontaneous breathing and nonlabored ventilation Cardiovascular status: stable Postop Assessment: no headache, no signs of nausea or vomiting, no backache, adequate PO intake, epidural receding and patient able to bend at knees Anesthetic complications: no    Last Vitals:  Vitals:   11/24/16 0316 11/24/16 0538  BP: (!) 113/51 (!) 104/54  Pulse: 96 90  Resp: 16 16  Temp: 36.9 C 36.8 C    Last Pain:  Vitals:   11/24/16 0538  TempSrc: Oral  PainSc:    Pain Goal:                 [name],Janice Saunders

## 2016-11-25 MED ORDER — IBUPROFEN 600 MG PO TABS: 600.0000 mg | ORAL_TABLET | Freq: Four times a day (QID) | ORAL | 0 refills | Status: AC

## 2016-11-25 NOTE — Plan of Care (Signed)
Problem: Pain Management: Goal: General experience of comfort will improve and pain level will decrease Outcome: Completed/Met Date Met: 11/25/16 Pt reports 5/10 cramping pain in her lower abdomen that comes and goes randomly.  Pt reports she has tried tylenol, motrin, heat but nothing seems to work too well.  Pt asked RN for something stronger.  Oxycodone is ordered.  RN offered other choices, such as more tylenol or motrin but pt reports she wants something else on top of it and asked RN if she would call the MD because "he said he would give her something before she leaves."  Pt reports she does not want any strong pain medication to go home but wants something to get her through going home and getting settled in the house.  RN then told pt, after the pt requested RN contact the MD, that she had oxycodone ordered and the pt asked for that 1 hour prior to leaving.

## 2016-11-25 NOTE — Progress Notes (Signed)
MOB was referred for history of depression/anxiety. * Referral screened out by Clinical Social Worker because none of the following criteria appear to apply: ~ History of anxiety/depression during this pregnancy, or of post-partum depression. ~ Diagnosis of anxiety and/or depression within last 3 years; CSW completed chart review and MOB was diagnosed in 2014.  No current concerns noted in OB records.  No SA since 2016. OR * MOB's symptoms currently being treated with medication and/or therapy.  Please contact the Clinical Social Worker if needs arise, by MOB request, or if MOB scores 9 or greater/yes to question 10 on Edinburgh Postpartum Depression Screen.  Jagger Demonte Boyd-Gilyard, MSW, LCSW Clinical Social Work [phone]   

## 2016-11-25 NOTE — Discharge Summary (Signed)
Obstetric Discharge Summary Reason for Admission: induction of labor Prenatal Procedures: ultrasound Intrapartum Procedures: spontaneous vaginal delivery Postpartum Procedures none Complications-Operative and Postpartum: 1st degree perineal laceration Hemoglobin  Date Value Ref Range Status  11/24/2016 9.2 (L) 12.0 - 15.0 g/dL Final   HCT  Date Value Ref Range Status  11/24/2016 27.9 (L) 36.0 - 46.0 % Final    Physical Exam:  General: alert and cooperative Lochia: appropriate Uterine Fundus: firm  Discharge Diagnoses: Term Pregnancy-delivered  Discharge Information: Date: 11/25/2016 Activity: pelvic rest Diet: routine Medications: PNV, Ibuprofen and Iron Condition: stable Instructions: refer to practice specific booklet Discharge to: home Follow-up Information    Janice Saunders, Dyanna, MD. Schedule an appointment as soon as possible for a visit in 1 month(s).   Specialty:  Obstetrics Contact information: [address] [place] [address] [phone]           Newborn Data: Live born female  Birth Weight: 8 lb 14.9 oz (4050 g) APGAR: 8, 9  Home with mother.  Messina Kosinski E 11/25/2016, 9:03 AM

## 2016-11-25 NOTE — Progress Notes (Signed)
PPD#2 Pt without complaints. Lochia wnl VSSAF IMP/ Stable Plan/Will discharge. 

## 2016-11-29 ENCOUNTER — Inpatient Hospital Stay (HOSPITAL_COMMUNITY)
Admission: AD | Admit: 2016-11-29 | Payer: Medicaid Other | Source: Ambulatory Visit | Admitting: Obstetrics and Gynecology

## 2021-07-21 ENCOUNTER — Emergency Department (HOSPITAL_BASED_OUTPATIENT_CLINIC_OR_DEPARTMENT_OTHER): Payer: Self-pay

## 2021-07-21 ENCOUNTER — Other Ambulatory Visit: Payer: Self-pay

## 2021-07-21 ENCOUNTER — Encounter (HOSPITAL_BASED_OUTPATIENT_CLINIC_OR_DEPARTMENT_OTHER): Payer: Self-pay | Admitting: Emergency Medicine

## 2021-07-21 ENCOUNTER — Emergency Department (HOSPITAL_BASED_OUTPATIENT_CLINIC_OR_DEPARTMENT_OTHER)
Admission: EM | Admit: 2021-07-21 | Discharge: 2021-07-21 | Disposition: A | Discharge status: Home / Self Care | Payer: Self-pay | Attending: Emergency Medicine | Admitting: Emergency Medicine

## 2021-07-21 DIAGNOSIS — J189 Pneumonia, unspecified organism: Secondary | ICD-10-CM | POA: Insufficient documentation

## 2021-07-21 DIAGNOSIS — Z20822 Contact with and (suspected) exposure to covid-19: Secondary | ICD-10-CM | POA: Insufficient documentation

## 2021-07-21 LAB — CBC WITH DIFFERENTIAL/PLATELET
Abs Immature Granulocytes: 0.1 10*3/uL — ABNORMAL HIGH (ref 0.00–0.07)
Basophils Absolute: 0.1 10*3/uL (ref 0.0–0.1)
Basophils Relative: 0 %
Eosinophils Absolute: 0.1 10*3/uL (ref 0.0–0.5)
Eosinophils Relative: 1 %
HCT: 36.9 % (ref 36.0–46.0)
Hemoglobin: 12.8 g/dL (ref 12.0–15.0)
Immature Granulocytes: 1 %
Lymphocytes Relative: 12 %
Lymphs Abs: 1.8 10*3/uL (ref 0.7–4.0)
MCH: 33.3 pg (ref 26.0–34.0)
MCHC: 34.7 g/dL (ref 30.0–36.0)
MCV: 96.1 fL (ref 80.0–100.0)
Monocytes Absolute: 1.1 10*3/uL — ABNORMAL HIGH (ref 0.1–1.0)
Monocytes Relative: 7 %
Neutro Abs: 12.2 10*3/uL — ABNORMAL HIGH (ref 1.7–7.7)
Neutrophils Relative %: 79 %
Platelets: 350 10*3/uL (ref 150–400)
RBC: 3.84 MIL/uL — ABNORMAL LOW (ref 3.87–5.11)
RDW: 11.5 % (ref 11.5–15.5)
WBC: 15.3 10*3/uL — ABNORMAL HIGH (ref 4.0–10.5)
nRBC: 0 % (ref 0.0–0.2)

## 2021-07-21 LAB — RESP PANEL BY RT-PCR (FLU A&B, COVID) ARPGX2
Influenza A by PCR: NEGATIVE
Influenza B by PCR: NEGATIVE
SARS Coronavirus 2 by RT PCR: NEGATIVE

## 2021-07-21 LAB — BASIC METABOLIC PANEL
Anion gap: 10 (ref 5–15)
BUN: 9 mg/dL (ref 6–20)
CO2: 24 mmol/L (ref 22–32)
Calcium: 8.8 mg/dL — ABNORMAL LOW (ref 8.9–10.3)
Chloride: 101 mmol/L (ref 98–111)
Creatinine, Ser: 0.55 mg/dL (ref 0.44–1.00)
GFR, Estimated: >60 mL/min (ref >60)
Glucose, Bld: 94 mg/dL (ref 70–99)
Potassium: 3.3 mmol/L — ABNORMAL LOW (ref 3.5–5.1)
Sodium: 135 mmol/L (ref 135–145)

## 2021-07-21 MED ORDER — AMOXICILLIN-POT CLAVULANATE 875-125 MG PO TABS: 1.0000 | ORAL_TABLET | Freq: Two times a day (BID) | ORAL | 0 refills | Status: DC

## 2021-07-21 MED ORDER — AMOXICILLIN-POT CLAVULANATE 875-125 MG PO TABS: 1.0000 | ORAL_TABLET | Freq: Two times a day (BID) | ORAL | 0 refills | Status: AC

## 2021-07-21 MED ORDER — SODIUM CHLORIDE 0.9 % IV BOLUS
1000.0000 mL | Freq: Once | INTRAVENOUS | Status: AC
Start: 2021-07-21 — End: 2021-07-21
  Administered 2021-07-21: 1000 mL via INTRAVENOUS

## 2021-07-21 MED ORDER — SODIUM CHLORIDE 0.9 % IV BOLUS
1000.0000 mL | Freq: Once | INTRAVENOUS | Status: AC
  Administered 2021-07-21: 1000 mL via INTRAVENOUS

## 2021-07-21 MED ORDER — CEFTRIAXONE SODIUM 1 G IJ SOLR
1.0000 g | Freq: Once | INTRAMUSCULAR | Status: AC
  Administered 2021-07-21: 1 g via INTRAVENOUS
  Filled 2021-07-21: qty 10

## 2021-07-21 NOTE — Discharge Instructions (Addendum)
Return if any problems.  See your Physician for recheck in 1 week   °

## 2021-07-21 NOTE — ED Triage Notes (Signed)
Pt reports productive cough x 2 weeks. Pt reports taking OTC medication with no relief. Reports low grade fevers.  ?

## 2021-07-21 NOTE — ED Notes (Signed)
States her children have been sick at home with URI and one with strep throat, she has been having nasal congestion, cough, face aching etc for the past week. States that the entire household was tested for Covid and Flu and all were negative. She also states she has a lot of nasal congestion.  ?

## 2021-07-21 NOTE — ED Provider Notes (Signed)
?[facility] EMERGENCY DEPARTMENT ?Provider Note ? ? ?CSN: [id number] ?Arrival date & time: 07/21/21  1026 ? ?  ? ?History ? ?Chief Complaint  ?Patient presents with  ? Cough  ? ? ?Janice Saunders is a 36 y.o. female. ? ?Pt complains of a cough for the past 2 weeks. ? ?The history is provided by the patient. No language interpreter was used.  ?Cough ?Cough characteristics:  Productive ?Sputum characteristics:  Nondescript ?Severity:  Moderate ?Onset quality:  Gradual ?Timing:  Constant ?Progression:  Worsening ?Chronicity:  New ?Smoker: no   ?Relieved by:  Nothing ?Worsened by:  Nothing ?Ineffective treatments:  None tried ?Associated symptoms: no chest pain and no fever   ?Risk factors: no recent infection   ? ?  ? ?Home Medications ?Prior to Admission medications   ?Medication Sig Start Date End Date Taking? Authorizing Provider  ?ibuprofen (ADVIL,MOTRIN) 600 MG tablet Take 1 tablet (600 mg total) by mouth every 6 (six) hours. 11/25/16   Levi Aland, MD  ?Prenatal Vit-Fe Fumarate-FA (PRENATAL MULTIVITAMIN) TABS tablet Take 1 tablet by mouth daily at 12 noon.    [provider]  ?   ? ?Allergies    ?Patient has no known allergies.   ? ?Review of Systems   ?Review of Systems  ?Constitutional:  Negative for fever.  ?Respiratory:  Positive for cough.   ?Cardiovascular:  Negative for chest pain.  ?All other systems reviewed and are negative. ? ?Physical Exam ?Updated Vital Signs ?BP 109/71   Pulse (!) 108   Temp 98.4 ?F (36.9 ?C) (Oral)   Resp 14   SpO2 100%  ?Physical Exam ?Vitals and nursing note reviewed.  ?Constitutional:   ?   Appearance: She is well-developed.  ?HENT:  ?   Head: Normocephalic.  ?   Right Ear: External ear normal.  ?   Left Ear: External ear normal.  ?   Nose: Nose normal.  ?   Mouth/Throat:  ?   Mouth: Mucous membranes are moist.  ?Eyes:  ?   Extraocular Movements: Extraocular movements intact.  ?   Pupils: Pupils are equal, round, and reactive to light.  ?Cardiovascular:  ?    Rate and Rhythm: Tachycardia present.  ?Pulmonary:  ?   Effort: Pulmonary effort is normal.  ?   Breath sounds: Rhonchi present.  ?Abdominal:  ?   General: Abdomen is flat. There is no distension.  ?Musculoskeletal:     ?   General: Normal range of motion.  ?   Cervical back: Normal range of motion.  ?Skin: ?   General: Skin is warm.  ?Neurological:  ?   General: No focal deficit present.  ?   Mental Status: She is alert and oriented to person, place, and time.  ?Psychiatric:     ?   Mood and Affect: Mood normal.  ? ? ?ED Results / Procedures / Treatments   ?Labs ?(all labs ordered are listed, but only abnormal results are displayed) ?Labs Reviewed  ?CBC WITH DIFFERENTIAL/PLATELET - Abnormal; Notable for the following components:  ?    Result Value  ? WBC 15.3 (*)   ? RBC 3.84 (*)   ? Neutro Abs 12.2 (*)   ? Monocytes Absolute 1.1 (*)   ? Abs Immature Granulocytes 0.10 (*)   ? All other components within normal limits  ?BASIC METABOLIC PANEL - Abnormal; Notable for the following components:  ? Potassium 3.3 (*)   ? Calcium 8.8 (*)   ? All other components  within normal limits  ?RESP PANEL BY RT-PCR (FLU A&B, COVID) ARPGX2  ? ? ?EKG ?None ? ?Radiology ?DG Chest 2 View ? ?Result Date: 07/21/2021 ?CLINICAL DATA:  Cough for 2 weeks.  Chest pain.  Rib pain. EXAM: CHEST - 2 VIEW COMPARISON:  05/05/2013 FINDINGS: Multiple leads and wires project over the chest on the frontal radiograph. Midline trachea. Normal heart size and mediastinal contours. No pleural effusion or pneumothorax. Bibasilar pulmonary opacities on the frontal radiograph. On the lateral view, there is anterior density which is new and likely corresponds to right middle lobe opacity on the frontal radiograph. IMPRESSION: Multifocal basilar predominant pulmonary opacities, likely pneumonia. Correlate with infectious symptoms. Given the peripheral predominant densities and the clinical history of chest/rib pain, pulmonary infarcts would be a secondary  consideration and if no infectious symptoms, CTA chest suggested. Electronically Signed   By: Jeronimo Greaves M.D.   On: 07/21/2021 11:02   ? ?Procedures ?Procedures  ? ? ?Medications Ordered in ED ?Medications  ?sodium chloride 0.9 % bolus 1,000 mL (1,000 mLs Intravenous New Bag/Given 07/21/21 1324)  ?sodium chloride 0.9 % bolus 1,000 mL (0 mLs Intravenous Stopped 07/21/21 1334)  ?cefTRIAXone (ROCEPHIN) 1 g in sodium chloride 0.9 % 100 mL IVPB (0 g Intravenous Stopped 07/21/21 1307)  ? ? ?ED Course/ Medical Decision Making/ A&P ?  ?                        ?Medical Decision Making ?Pt complains of a cough and congestion.   ? ?Amount and/or Complexity of Data Reviewed ?Labs: ordered. Decision-making details documented in ED Course. ?   Details: Pt has an elevated wbc count ?Radiology: ordered and independent interpretation performed. Decision-making details documented in ED Course. ?   Details: Chest xray ordered reviewed and interpreted.  Chest xray shows multifocal opacities ? ?Risk ?Prescription drug management. ?Risk Details: Pt given Iv fluids x 1 liter.  Pulse rate improved.  Pt given Rocephin 1gram IM  Pt reports she feels better.  Pt given rx for augmentin Pt advised to return if any problems.  ? ? ? ? ? ? ? ? ? ? ?Final Clinical Impression(s) / ED Diagnoses ?Final diagnoses:  ?Community acquired pneumonia, unspecified laterality  ? ? ?Rx / DC Orders ?ED Discharge Orders   ? ?      Ordered  ?  amoxicillin-clavulanate (AUGMENTIN) 875-125 MG tablet  2 times daily       ? 07/21/21 1415  ? ?  ?  ? ?  ?An After Visit Summary was printed and given to the patient.  ? ?  ?Elson Areas, [place] ?07/21/21 1519 ? ?  ?Linwood Dibbles, MD ?07/21/21 2047 ? ?
